# Patient Record
Sex: Female | Born: 2000 | Race: White | Hispanic: Yes | Marital: Single | Smoking: Never smoker
Health system: Southern US, Community
[De-identification: ages and names within clinical notes are randomized; demographics above are authoritative.]

## PROBLEM LIST (undated history)

## (undated) ENCOUNTER — Inpatient Hospital Stay (HOSPITAL_COMMUNITY): Payer: Self-pay

## (undated) DIAGNOSIS — Z789 Other specified health status: Secondary | ICD-10-CM

## (undated) HISTORY — PX: NO PAST SURGERIES: SHX2092

---
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
## 2019-04-16 DIAGNOSIS — H52223 Regular astigmatism, bilateral: Secondary | ICD-10-CM | POA: Diagnosis not present

## 2019-04-16 DIAGNOSIS — H53029 Refractive amblyopia, unspecified eye: Secondary | ICD-10-CM | POA: Diagnosis not present

## 2019-04-16 DIAGNOSIS — H538 Other visual disturbances: Secondary | ICD-10-CM | POA: Diagnosis not present

## 2019-04-29 DIAGNOSIS — H5213 Myopia, bilateral: Secondary | ICD-10-CM | POA: Diagnosis not present

## 2019-05-26 DIAGNOSIS — H5213 Myopia, bilateral: Secondary | ICD-10-CM | POA: Diagnosis not present

## 2019-07-21 ENCOUNTER — Inpatient Hospital Stay (HOSPITAL_COMMUNITY): Payer: Medicaid Other

## 2019-07-21 ENCOUNTER — Encounter (HOSPITAL_COMMUNITY): Payer: Self-pay | Admitting: Obstetrics and Gynecology

## 2019-07-21 ENCOUNTER — Inpatient Hospital Stay (HOSPITAL_COMMUNITY)
Admission: AD | Admit: 2019-07-21 | Discharge: 2019-07-21 | Disposition: A | Discharge status: Home / Self Care | Payer: Medicaid Other | Attending: Obstetrics and Gynecology | Admitting: Obstetrics and Gynecology

## 2019-07-21 ENCOUNTER — Other Ambulatory Visit: Payer: Self-pay

## 2019-07-21 DIAGNOSIS — R109 Unspecified abdominal pain: Secondary | ICD-10-CM | POA: Insufficient documentation

## 2019-07-21 DIAGNOSIS — Z3A00 Weeks of gestation of pregnancy not specified: Secondary | ICD-10-CM

## 2019-07-21 DIAGNOSIS — O26891 Other specified pregnancy related conditions, first trimester: Secondary | ICD-10-CM | POA: Insufficient documentation

## 2019-07-21 DIAGNOSIS — O99891 Other specified diseases and conditions complicating pregnancy: Secondary | ICD-10-CM | POA: Diagnosis not present

## 2019-07-21 DIAGNOSIS — M549 Dorsalgia, unspecified: Secondary | ICD-10-CM

## 2019-07-21 DIAGNOSIS — O3680X0 Pregnancy with inconclusive fetal viability, not applicable or unspecified: Secondary | ICD-10-CM

## 2019-07-21 DIAGNOSIS — Y92414 Local residential or business street as the place of occurrence of the external cause: Secondary | ICD-10-CM | POA: Diagnosis not present

## 2019-07-21 DIAGNOSIS — Z3A01 Less than 8 weeks gestation of pregnancy: Secondary | ICD-10-CM | POA: Insufficient documentation

## 2019-07-21 DIAGNOSIS — O9A211 Injury, poisoning and certain other consequences of external causes complicating pregnancy, first trimester: Secondary | ICD-10-CM | POA: Diagnosis not present

## 2019-07-21 DIAGNOSIS — M545 Low back pain: Secondary | ICD-10-CM | POA: Diagnosis not present

## 2019-07-21 LAB — CBC
HCT: 37 % (ref 36.0–46.0)
Hemoglobin: 11.9 g/dL — ABNORMAL LOW (ref 12.0–15.0)
MCH: 26.7 pg (ref 26.0–34.0)
MCHC: 32.2 g/dL (ref 30.0–36.0)
MCV: 83 fL (ref 80.0–100.0)
Platelets: 249 10*3/uL (ref 150–400)
RBC: 4.46 MIL/uL (ref 3.87–5.11)
RDW: 16.3 % — ABNORMAL HIGH (ref 11.5–15.5)
WBC: 6.1 10*3/uL (ref 4.0–10.5)
nRBC: 0 % (ref 0.0–0.2)

## 2019-07-21 LAB — WET PREP, GENITAL
Clue Cells Wet Prep HPF POC: NONE SEEN
Sperm: NONE SEEN
Trich, Wet Prep: NONE SEEN
Yeast Wet Prep HPF POC: NONE SEEN

## 2019-07-21 LAB — URINALYSIS, ROUTINE W REFLEX MICROSCOPIC
Bacteria, UA: NONE SEEN
Bilirubin Urine: NEGATIVE
Glucose, UA: NEGATIVE mg/dL
Hgb urine dipstick: NEGATIVE
Ketones, ur: 80 mg/dL — AB
Leukocytes,Ua: NEGATIVE
Nitrite: NEGATIVE
Protein, ur: 100 mg/dL — AB
Specific Gravity, Urine: 1.03 (ref 1.005–1.030)
pH: 5 (ref 5.0–8.0)

## 2019-07-21 LAB — POCT PREGNANCY, URINE: Preg Test, Ur: POSITIVE — AB

## 2019-07-21 LAB — HCG, QUANTITATIVE, PREGNANCY: hCG, Beta Chain, Quant, S: 654 m[IU]/mL — ABNORMAL HIGH (ref <5)
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
## 2019-07-21 NOTE — Discharge Instructions (Signed)
Abdominal Pain During Pregnancy  Belly (abdominal) pain is common during pregnancy. There are many possible causes. Most of the time, it is not a serious problem. Other times, it can be a sign that something is wrong with the pregnancy. Always tell your doctor if you have belly pain. Follow these instructions at home:  Do not have sex or put anything in your vagina until your pain goes away completely.  Get plenty of rest until your pain gets better.  Drink enough fluid to keep your pee (urine) pale yellow.  Take over-the-counter and prescription medicines only as told by your doctor.  Keep all follow-up visits as told by your doctor. This is important. Contact a doctor if:  Your pain continues or gets worse after resting.  You have lower belly pain that: ? Comes and goes at regular times. ? Spreads to your back. ? Feels like menstrual cramps.  You have pain or burning when you pee (urinate). Get help right away if:  You have a fever or chills.  You have vaginal bleeding.  You are leaking fluid from your vagina.  You are passing tissue from your vagina.  You throw up (vomit) for more than 24 hours.  You have watery poop (diarrhea) for more than 24 hours.  Your baby is moving less than usual.  You feel very weak or faint.  You have shortness of breath.  You have very bad pain in your upper belly. Summary  Belly (abdominal) pain is common during pregnancy. There are many possible causes.  If you have belly pain during pregnancy, tell your doctor right away.  Keep all follow-up visits as told by your doctor. This is important. This information is not intended to replace advice given to you by your health care provider. Make sure you discuss any questions you have with your health care provider. Document Revised: 06/23/2018 Document Reviewed: 06/07/2016 Elsevier Patient Education  [place].   Acute Back Pain, Adult Acute back pain is sudden and usually  short-lived. It is often caused by an injury to the muscles and tissues in the back. The injury may result from:  A muscle or ligament getting overstretched or torn (strained). Ligaments are tissues that connect bones to each other. Lifting something improperly can cause a back strain.  Wear and tear (degeneration) of the spinal disks. Spinal disks are circular tissue that provides cushioning between the bones of the spine (vertebrae).  Twisting motions, such as while playing sports or doing yard work.  A hit to the back.  Arthritis. You may have a physical exam, lab tests, and imaging tests to find the cause of your pain. Acute back pain usually goes away with rest and home care. Follow these instructions at home: Managing pain, stiffness, and swelling  Take over-the-counter and prescription medicines only as told by your health care provider.  Your health care provider may recommend applying ice during the first 24-48 hours after your pain starts. To do this: ? Put ice in a plastic bag. ? Place a towel between your skin and the bag. ? Leave the ice on for 20 minutes, 2-3 times a day.  If directed, apply heat to the affected area as often as told by your health care provider. Use the heat source that your health care provider recommends, such as a moist heat pack or a heating pad. ? Place a towel between your skin and the heat source. ? Leave the heat on for 20-30 minutes. ? Remove  the heat if your skin turns bright red. This is especially important if you are unable to feel pain, heat, or cold. You have a greater risk of getting burned. Activity   Do not stay in bed. Staying in bed for more than 1-2 days can delay your recovery.  Sit up and stand up straight. Avoid leaning forward when you sit, or hunching over when you stand. ? If you work at a desk, sit close to it so you do not need to lean over. Keep your chin tucked in. Keep your neck drawn back, and keep your elbows bent at a  right angle. Your arms should look like the letter "L." ? Sit high and close to the steering wheel when you drive. Add lower back (lumbar) support to your car seat, if needed.  Take short walks on even surfaces as soon as you are able. Try to increase the length of time you walk each day.  Do not sit, drive, or stand in one place for more than 30 minutes at a time. Sitting or standing for long periods of time can put stress on your back.  Do not drive or use heavy machinery while taking prescription pain medicine.  Use proper lifting techniques. When you bend and lift, use positions that put less stress on your back: ? [place] your knees. ? Keep the load close to your body. ? Avoid twisting.  Exercise regularly as told by your health care provider. Exercising helps your back heal faster and helps prevent back injuries by keeping muscles strong and flexible.  Work with a physical therapist to make a safe exercise program, as recommended by your health care provider. Do any exercises as told by your physical therapist. Lifestyle  Maintain a healthy weight. Extra weight puts stress on your back and makes it difficult to have good posture.  Avoid activities or situations that make you feel anxious or stressed. Stress and anxiety increase muscle tension and can make back pain worse. Learn ways to manage anxiety and stress, such as through exercise. General instructions  Sleep on a firm mattress in a comfortable position. Try lying on your side with your knees slightly bent. If you lie on your back, put a pillow under your knees.  Follow your treatment plan as told by your health care provider. This may include: ? Cognitive or behavioral therapy. ? Acupuncture or massage therapy. ? Meditation or yoga. Contact a health care provider if:  You have pain that is not relieved with rest or medicine.  You have increasing pain going down into your legs or buttocks.  Your pain does not improve  after 2 weeks.  You have pain at night.  You lose weight without trying.  You have a fever or chills. Get help right away if:  You develop new bowel or bladder control problems.  You have unusual weakness or numbness in your arms or legs.  You develop nausea or vomiting.  You develop abdominal pain.  You feel faint. Summary  Acute back pain is sudden and usually short-lived.  Use proper lifting techniques. When you bend and lift, use positions that put less stress on your back.  Take over-the-counter and prescription medicines and apply heat or ice as directed by your health care provider. This information is not intended to replace advice given to you by your health care provider. Make sure you discuss any questions you have with your health care provider. Document Revised: 06/24/2018 Document Reviewed: 10/17/2016 Elsevier Patient  Education  2020 Elsevier Inc.  

## 2019-07-22 LAB — GC/CHLAMYDIA PROBE AMP (~~LOC~~) NOT AT ARMC
Chlamydia: NEGATIVE
Comment: NEGATIVE
Comment: NORMAL
Neisseria Gonorrhea: NEGATIVE

## 2019-07-24 ENCOUNTER — Ambulatory Visit: Payer: Medicaid Other

## 2019-08-06 ENCOUNTER — Inpatient Hospital Stay (HOSPITAL_COMMUNITY)
Admission: AD | Admit: 2019-08-06 | Discharge: 2019-08-06 | Disposition: A | Discharge status: Home / Self Care | Payer: Medicaid Other | Attending: Obstetrics and Gynecology | Admitting: Obstetrics and Gynecology

## 2019-08-06 ENCOUNTER — Encounter (HOSPITAL_COMMUNITY): Payer: Self-pay | Admitting: Obstetrics and Gynecology

## 2019-08-06 ENCOUNTER — Inpatient Hospital Stay (HOSPITAL_COMMUNITY): Payer: Medicaid Other

## 2019-08-06 ENCOUNTER — Other Ambulatory Visit: Payer: Self-pay

## 2019-08-06 DIAGNOSIS — Z3A01 Less than 8 weeks gestation of pregnancy: Secondary | ICD-10-CM | POA: Insufficient documentation

## 2019-08-06 DIAGNOSIS — O209 Hemorrhage in early pregnancy, unspecified: Secondary | ICD-10-CM

## 2019-08-06 DIAGNOSIS — O200 Threatened abortion: Secondary | ICD-10-CM | POA: Insufficient documentation

## 2019-08-06 DIAGNOSIS — N939 Abnormal uterine and vaginal bleeding, unspecified: Secondary | ICD-10-CM | POA: Diagnosis present

## 2019-08-06 LAB — URINALYSIS, ROUTINE W REFLEX MICROSCOPIC
Bacteria, UA: NONE SEEN
Bilirubin Urine: NEGATIVE
Glucose, UA: NEGATIVE mg/dL
Ketones, ur: NEGATIVE mg/dL
Leukocytes,Ua: NEGATIVE
Nitrite: NEGATIVE
Protein, ur: NEGATIVE mg/dL
Specific Gravity, Urine: 1.016 (ref 1.005–1.030)
pH: 6 (ref 5.0–8.0)

## 2019-08-06 LAB — CBC
HCT: 39.8 % (ref 36.0–46.0)
Hemoglobin: 12.7 g/dL (ref 12.0–15.0)
MCH: 26.7 pg (ref 26.0–34.0)
MCHC: 31.9 g/dL (ref 30.0–36.0)
MCV: 83.8 fL (ref 80.0–100.0)
Platelets: 243 10*3/uL (ref 150–400)
RBC: 4.75 MIL/uL (ref 3.87–5.11)
RDW: 15.9 % — ABNORMAL HIGH (ref 11.5–15.5)
WBC: 5.4 10*3/uL (ref 4.0–10.5)
nRBC: 0 % (ref 0.0–0.2)

## 2019-08-06 LAB — HCG, QUANTITATIVE, PREGNANCY: hCG, Beta Chain, Quant, S: 5346 m[IU]/mL — ABNORMAL HIGH (ref <5)

## 2019-08-06 MED ORDER — VITAFOL ULTRA 29-0.6-0.4-200 MG PO CAPS: 1.0000 | ORAL_CAPSULE | Freq: Every day | ORAL | 11 refills | Status: AC

## 2019-08-06 NOTE — Discharge Instructions (Signed)
Threatened Miscarriage  A threatened miscarriage occurs when a woman has vaginal bleeding during the first 20 weeks of pregnancy but the pregnancy has not ended. If you have vaginal bleeding during this time, your health care provider will do tests to make sure you are still pregnant. If the tests show that you are still pregnant and that the developing baby (fetus) inside your uterus is still growing, your condition is considered a threatened miscarriage. A threatened miscarriage does not mean your pregnancy will end, but it does increase the risk of losing your pregnancy (complete miscarriage). What are the causes? The cause of this condition is usually not known. For women who go on to have a complete miscarriage, the most common cause is an abnormal number of chromosomes in the developing baby. Chromosomes are the structures inside cells that hold all of a person's genetic material. What increases the risk? The following lifestyle factors may increase your risk of a miscarriage in early pregnancy:  Smoking.  Drinking excessive amounts of alcohol or caffeine.  Recreational drug use. The following preexisting health conditions may increase your risk of a miscarriage in early pregnancy:  Polycystic ovary syndrome.  Uterine fibroids.  Infections.  Diabetes mellitus. What are the signs or symptoms? Symptoms of this condition include:  Vaginal bleeding.  Mild abdominal pain or cramps. How is this diagnosed? If you have bleeding with or without abdominal pain before 20 weeks of pregnancy, your health care provider will do tests to check whether you are still pregnant. These will include:  Ultrasound. This test uses sound waves to create images of the inside of your uterus. This allows your health care provider to look at your developing baby and other structures, such as your placenta.  Pelvic exam. This is an internal exam of your vagina and cervix.  Measurement of your baby's heart  rate.  Laboratory tests such as blood tests, urine tests, or swabs for infection You may be diagnosed with a threatened miscarriage if:  Ultrasound testing shows that you are still pregnant.  Your baby's heart rate is strong.  A pelvic exam shows that the opening between your uterus and your vagina (cervix) is closed.  Blood tests confirm that you are still pregnant. How is this treated? No treatments have been shown to prevent a threatened miscarriage from going on to a complete miscarriage. However, the right home care is important. Follow these instructions at home:  Get plenty of rest.  Do not have sex or use tampons if you have vaginal bleeding.  Do not douche.  Do not smoke or use recreational drugs.  Do not drink alcohol.  Avoid caffeine.  Keep all follow-up prenatal visits as told by your health care provider. This is important. Contact a health care provider if:  You have light vaginal bleeding or spotting while pregnant.  You have abdominal pain or cramping.  You have a fever. Get help right away if:  You have heavy vaginal bleeding.  You have blood clots coming from your vagina.  You pass tissue from your vagina.  You leak fluid, or you have a gush of fluid from your vagina.  You have severe low back pain or abdominal cramps.  You have fever, chills, and severe abdominal pain. Summary  A threatened miscarriage occurs when a woman has vaginal bleeding during the first 20 weeks of pregnancy but the pregnancy has not ended.  The cause of a threatened miscarriage is usually not known.  Symptoms of this condition may   include vaginal bleeding and mild abdominal pain or cramps.  No treatments have been shown to prevent a threatened miscarriage from going on to a complete miscarriage.  Keep all follow-up prenatal visits as told by your health care provider. This is important. This information is not intended to replace advice given to you by your health  care provider. Make sure you discuss any questions you have with your health care provider. Document Revised: 04/11/2017 Document Reviewed: 06/01/2016 Elsevier Patient Education  2020 [employer].  [facility]          [facility] locations:  Hours may vary. Please call for an appointment  [facility]   [address], [place] [phone]  [facility] @ [facility]   [address]  [phone]  [facility] @ [facility]       [address] [phone]            [facility] @ [place]     [phone]          [facility] @ [facility]   [address] [phone]  [facility] @ [facility]  [address] [phone]     [facility] @ [facility])  [address]   [phone]

## 2019-08-06 NOTE — MAU Provider Note (Signed)
History     CSN: [id number]  Arrival date and time: 08/06/19 1346   First Provider Initiated Contact with Patient 08/06/19 1438      Chief Complaint  Patient presents with  . Vaginal Bleeding   HPI Beverly Shah 19 y.o. Positive pregnancy test on 07-19-19.  Has a history of irregular cycles.  Began having intermittent cramping and some vaginal spotting that was brown on yesterday.  Today the cramping has increased some but it is not continuous.  Bleeding is more red so she came for evaluation.  Was seen in early May and did not follow up for Quant.  Missed her appointment and did not reschedule.  Has not been seen yet for prenatal care.  Has not taken any medication for her cramping since it started.  OB History    Gravida  2   Para  1   Term  1   Preterm      AB      Living  1     SAB      TAB      Ectopic      Multiple  0   Live Births  1           Past Medical History:  Diagnosis Date  . Medical history non-contributory     Past Surgical History:  Procedure Laterality Date  . NO PAST SURGERIES      No family history on file.  Social History   Tobacco Use  . Smoking status: Never Smoker  . Smokeless tobacco: Never Used  Substance Use Topics  . Alcohol use: Not Currently  . Drug use: Not Currently    Types: Marijuana    Comment: quit 2 days ago     Allergies: No Known Allergies  Medications Prior to Admission  Medication Sig Dispense Refill Last Dose  . Prenatal Vit-Fe Phos-FA-Omega (VITAFOL GUMMIES) 3.33-0.333-34.8 MG CHEW Chew 3 each by mouth daily. (Patient not taking: Reported on 10/13/2018) 90 tablet 11     Review of Systems  Constitutional: Negative for fever.  Gastrointestinal: Positive for abdominal pain and nausea. Negative for vomiting.  Genitourinary: Positive for vaginal bleeding. Negative for dysuria and vaginal discharge.   Physical Exam   Blood pressure 128/66, pulse 77, temperature 99.2 F (37.3 C), resp.  rate 18, height 5\' 3"  (1.6 m), weight 77.1 kg, last menstrual period 06/18/2019, unknown if currently breastfeeding.  Physical Exam  Nursing note and vitals reviewed. Constitutional: She is oriented to person, place, and time. She appears well-developed and well-nourished.  HENT:  Head: Normocephalic.  Eyes: EOM are normal.  Cardiovascular: Normal rate.  Respiratory: Effort normal.  GI: Soft. There is no abdominal tenderness. There is no rebound and no guarding.  Musculoskeletal:        General: Normal range of motion.     Cervical back: Neck supple.  Neurological: She is alert and oriented to person, place, and time.  Skin: Skin is warm and dry.  Psychiatric: She has a normal mood and affect.    MAU Course  Procedures CLINICAL DATA:  Vaginal bleeding in first trimester of pregnancy; quantitative beta HCG 5,346 on 78/29/5621; uncertain LMP question 06/18/2019  EXAM: TRANSVAGINAL OB ULTRASOUND  TECHNIQUE: Transvaginal ultrasound was performed for complete evaluation of the gestation as well as the maternal uterus, adnexal regions, and pelvic cul-de-sac.  COMPARISON:  5/21  FINDINGS: Intrauterine gestational sac: Present, single, located in lower uterine segment  Yolk sac:  Present  Embryo:  Present  Cardiac Activity: Present, irregular  Heart Rate: Varied between 50 and 113 beats per minute.  CRL:   4.5 mm   6 w 1 d                  [place] EDC: 03/30/2020  Subchorionic hemorrhage:  Question trace subchorionic hemorrhage  Maternal uterus/adnexae:  Maternal uterus otherwise normal appearance.  RIGHT ovary normal size and morphology 2.2 x 2.3 x 2.6 cm, containing small corpus luteum.  LEFT ovary normal size and morphology, 2.0 x 1.0 x 2.0 cm.  No free pelvic fluid or adnexal masses.  IMPRESSION: Small gestational sac containing a yolk sac and a fetal pole identified in the lower uterine segment endometrium posteriorly.  Crown-rump length  corresponds to 6 weeks 1 day EGA.  Irregular fetal cardiac rate identified.  LABS Results for orders placed or performed during the hospital encounter of 08/06/19 (from the past 24 hour(s))  Urinalysis, Routine w reflex microscopic     Status: Abnormal   Collection Time: 08/06/19  2:05 PM  Result Value Ref Range   Color, Urine YELLOW YELLOW   APPearance CLEAR CLEAR   Specific Gravity, Urine 1.016 1.005 - 1.030   pH 6.0 5.0 - 8.0   Glucose, UA NEGATIVE NEGATIVE mg/dL   Hgb urine dipstick LARGE (A) NEGATIVE   Bilirubin Urine NEGATIVE NEGATIVE   Ketones, ur NEGATIVE NEGATIVE mg/dL   Protein, ur NEGATIVE NEGATIVE mg/dL   Nitrite NEGATIVE NEGATIVE   Leukocytes,Ua NEGATIVE NEGATIVE   RBC / HPF 0-5 0 - 5 RBC/hpf   WBC, UA 0-5 0 - 5 WBC/hpf   Bacteria, UA NONE SEEN NONE SEEN   Squamous Epithelial / LPF 0-5 0 - 5   Mucus PRESENT   CBC     Status: Abnormal   Collection Time: 08/06/19  2:21 PM  Result Value Ref Range   WBC 5.4 4.0 - 10.5 K/uL   RBC 4.75 3.87 - 5.11 MIL/uL   Hemoglobin 12.7 12.0 - 15.0 g/dL   HCT 94.4 96.7 - 59.1 %   MCV 83.8 80.0 - 100.0 fL   MCH 26.7 26.0 - 34.0 pg   MCHC 31.9 30.0 - 36.0 g/dL   RDW 63.8 (H) 46.6 - 59.9 %   Platelets 243 150 - 400 K/uL   nRBC 0.0 0.0 - 0.2 %  hCG, quantitative, pregnancy     Status: Abnormal   Collection Time: 08/06/19  2:21 PM  Result Value Ref Range   hCG, Beta Chain, Quant, S 5,346 (H) <5 mIU/mL     MDM Discussed ultrasound results with client.  Advised that at this time we cannot know for certain whether this is a healthy pregnancy or not.  There are some signs that it might not be healthy - irregular FHT, sac in lower uterine segment.  Advised there is no medication or other treatments can be given to make the pregnancy be healthy if it is not.  Discussed that her own body's signals may give an indication if this will be a healthy pregnancy.  If more cramping and bleeding occur, this may be a miscarriage.  Discussed usual  course of miscarriage and advised to return if she is having dizziness or feeling faint.  Will schedule [place] in 2 weeks to recheck.  Assessment and Plan  Threatened miscarriage at 6w gestation  Plan Expect ultrasound to call and schedule an appointment for 2 weeks to recheck this pregnancy. Return if you are having bleeding to the point you are  feeling faint or dizzy. Begin prenatal care if there is no further bleeding.  Office and numbers given.  Shaley Leavens L Tilman Mcclaren 08/06/2019, 2:40 PM

## 2019-08-06 NOTE — MAU Note (Signed)
Pt reports she stared having some dark brown spotting yesterday. Today is more pink then turned to more like period bleeding . Has had some cramping on and off today as well.

## 2019-08-09 ENCOUNTER — Other Ambulatory Visit: Payer: Self-pay

## 2019-08-09 ENCOUNTER — Inpatient Hospital Stay (HOSPITAL_COMMUNITY): Payer: Medicaid Other

## 2019-08-09 ENCOUNTER — Encounter (HOSPITAL_COMMUNITY): Payer: Self-pay | Admitting: Obstetrics and Gynecology

## 2019-08-09 ENCOUNTER — Inpatient Hospital Stay (HOSPITAL_COMMUNITY)
Admission: AD | Admit: 2019-08-09 | Discharge: 2019-08-10 | Disposition: A | Discharge status: Home / Self Care | Payer: Medicaid Other | Source: Ambulatory Visit | Attending: Obstetrics and Gynecology | Admitting: Obstetrics and Gynecology

## 2019-08-09 DIAGNOSIS — Z6790 Unspecified blood type, Rh positive: Secondary | ICD-10-CM | POA: Diagnosis not present

## 2019-08-09 DIAGNOSIS — O26891 Other specified pregnancy related conditions, first trimester: Secondary | ICD-10-CM | POA: Insufficient documentation

## 2019-08-09 DIAGNOSIS — Z3A01 Less than 8 weeks gestation of pregnancy: Secondary | ICD-10-CM | POA: Diagnosis not present

## 2019-08-09 DIAGNOSIS — R109 Unspecified abdominal pain: Secondary | ICD-10-CM | POA: Diagnosis not present

## 2019-08-09 DIAGNOSIS — O4690 Antepartum hemorrhage, unspecified, unspecified trimester: Secondary | ICD-10-CM

## 2019-08-09 DIAGNOSIS — O039 Complete or unspecified spontaneous abortion without complication: Secondary | ICD-10-CM | POA: Insufficient documentation

## 2019-08-09 DIAGNOSIS — O209 Hemorrhage in early pregnancy, unspecified: Secondary | ICD-10-CM | POA: Diagnosis not present

## 2019-08-09 DIAGNOSIS — Z3A00 Weeks of gestation of pregnancy not specified: Secondary | ICD-10-CM | POA: Diagnosis not present

## 2019-08-09 LAB — COMPREHENSIVE METABOLIC PANEL
ALT: 13 U/L (ref 0–44)
AST: 15 U/L (ref 15–41)
Albumin: 3.6 g/dL (ref 3.5–5.0)
Alkaline Phosphatase: 55 U/L (ref 38–126)
Anion gap: 10 (ref 5–15)
BUN: 8 mg/dL (ref 6–20)
CO2: 22 mmol/L (ref 22–32)
Calcium: 8.6 mg/dL — ABNORMAL LOW (ref 8.9–10.3)
Chloride: 109 mmol/L (ref 98–111)
Creatinine, Ser: 0.58 mg/dL (ref 0.44–1.00)
GFR calc Af Amer: >60 mL/min (ref >60)
GFR calc non Af Amer: >60 mL/min (ref >60)
Glucose, Bld: 111 mg/dL — ABNORMAL HIGH (ref 70–99)
Potassium: 4 mmol/L (ref 3.5–5.1)
Sodium: 141 mmol/L (ref 135–145)
Total Bilirubin: 0.6 mg/dL (ref 0.3–1.2)
Total Protein: 6.6 g/dL (ref 6.5–8.1)

## 2019-08-09 LAB — CBC
HCT: 36.5 % (ref 36.0–46.0)
Hemoglobin: 11.5 g/dL — ABNORMAL LOW (ref 12.0–15.0)
MCH: 26.8 pg (ref 26.0–34.0)
MCHC: 31.5 g/dL (ref 30.0–36.0)
MCV: 85.1 fL (ref 80.0–100.0)
Platelets: 233 10*3/uL (ref 150–400)
RBC: 4.29 MIL/uL (ref 3.87–5.11)
RDW: 15.9 % — ABNORMAL HIGH (ref 11.5–15.5)
WBC: 6.2 10*3/uL (ref 4.0–10.5)
nRBC: 0 % (ref 0.0–0.2)

## 2019-08-09 LAB — URINALYSIS, ROUTINE W REFLEX MICROSCOPIC
Bacteria, UA: NONE SEEN
Bilirubin Urine: NEGATIVE
Glucose, UA: NEGATIVE mg/dL
Ketones, ur: NEGATIVE mg/dL
Leukocytes,Ua: NEGATIVE
Nitrite: NEGATIVE
Protein, ur: NEGATIVE mg/dL
RBC / HPF: >50 RBC/hpf — ABNORMAL HIGH (ref 0–5)
Specific Gravity, Urine: 1.026 (ref 1.005–1.030)
pH: 6 (ref 5.0–8.0)

## 2019-08-09 LAB — HCG, QUANTITATIVE, PREGNANCY: hCG, Beta Chain, Quant, S: 1313 m[IU]/mL — ABNORMAL HIGH (ref <5)

## 2019-08-09 NOTE — MAU Note (Signed)
Patient reports to MAU stating she has been experiencing vaginal bleeding since her last visit to MAU however today she passed some clots and what appeared to be grayish tissue. Pt reports off and on today she has had cramping and back pain that can go anywhere from a 0-8/10. Pt reports the use of 4 pads all day today. Pt denies pain currently.   119/65 bp 73 pulse 98.1 temp 100 spO2
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
## 2019-08-10 LAB — GC/CHLAMYDIA PROBE AMP (~~LOC~~) NOT AT ARMC
Chlamydia: NEGATIVE
Comment: NEGATIVE
Comment: NORMAL
Neisseria Gonorrhea: NEGATIVE

## 2019-08-10 LAB — WET PREP, GENITAL
Clue Cells Wet Prep HPF POC: NONE SEEN
Sperm: NONE SEEN
Trich, Wet Prep: NONE SEEN
Yeast Wet Prep HPF POC: NONE SEEN

## 2019-08-10 MED ORDER — MISOPROSTOL 200 MCG PO TABS
800.0000 ug | ORAL_TABLET | Freq: Once | ORAL | 0 refills | Status: DC
Start: 2019-08-10 — End: 2022-03-25

## 2019-08-10 MED ORDER — OXYCODONE HCL 5 MG PO TABS: 5.0000 mg | ORAL_TABLET | Freq: Three times a day (TID) | ORAL | 0 refills | Status: AC | PRN

## 2019-08-10 MED ORDER — PROMETHAZINE HCL 12.5 MG PO TABS
12.5000 mg | ORAL_TABLET | Freq: Four times a day (QID) | ORAL | 0 refills | Status: DC | PRN
Start: 2019-08-10 — End: 2022-03-25

## 2019-08-10 MED ORDER — IBUPROFEN 600 MG PO TABS
600.0000 mg | ORAL_TABLET | Freq: Four times a day (QID) | ORAL | 0 refills | Status: DC | PRN
Start: 2019-08-10 — End: 2022-03-25

## 2019-08-10 MED ORDER — ACETAMINOPHEN 325 MG PO TABS
650.0000 mg | ORAL_TABLET | Freq: Four times a day (QID) | ORAL | 0 refills | Status: AC | PRN
Start: 2019-08-10 — End: 2020-08-09

## 2019-08-10 NOTE — Discharge Instructions (Signed)
FACTS YOU SHOULD KNOW  WHAT IS AN EARLY PREGNANCY FAILURE? Once the egg is fertilized with the sperm and begins to develop, it attaches to the lining of the uterus. This early pregnancy tissue may not develop into an embryo (the beginning stage of a baby). Sometimes an embryo does develop but does not continue to grow. These problems can be seen on ultrasound.   MANAGEMNT OF EARLY PREGNANCY FAILURE: About 4 out of 100 (0.25%) women will have a pregnancy loss in her lifetime.  One in five pregnancies is found to be an early pregnancy failure.  There are 3 ways to care for an early pregnancy failure:   (1) Surgery, (2) Medicine, (3) Waiting for you to pass the pregnancy on your own. The decision as to how to proceed after being diagnosed with and early pregnancy failure is an individual one.  The decision can be made only after appropriate counseling.  You need to weigh the pros and cons of the 3 choices. Then you can make the choice that works for you. SURGERY (D&E) . Procedure over in 1 day . Requires being put to sleep . Bleeding may be light . Possible problems during surgery, including injury to womb(uterus) . Care provider has more control Medicine (CYTOTEC) . The complete procedure may take days to weeks . No Surgery . Bleeding may be heavy at times . There may be drug side effects . Patient has more control Waiting . You may choose to wait, in which case your own body may complete the passing of the abnormal early pregnancy on its own in about 2-4 weeks . Your bleeding may be heavy at times . There is a small possibility that you may need surgery if the bleeding is too much or not all of the pregnancy has passed. CYTOTEC MANAGEMENT Prostaglandins (cytotec) are the most widely used drug for this purpose. They cause the uterus to cramp and contract. You will place the medicine yourself inside your vagina in the privacy of your home. Empting of the uterus should occur within 3 days but  the process may continue for several weeks. The bleeding may seem heavy at times. POSSIBLE SIDE EFFECTS FROM CYTOTEC . Nausea   Vomiting . Diarrhea Fever . Chills  Hot Flashes Side effects  from the process of the early pregnancy failure include: . Cramping  Bleeding . Headaches  Dizziness RISKS: This is a low risk procedure. Less than 1 in 100 women has a complication. An incomplete passage of the early pregnancy may occur. Also, Hemorrhage (heavy bleeding) could happen.  Rarely the pregnancy will not be passed completely. Excessively heavy bleeding may occur.  Your doctor may need to perform surgery to empty the uterus (D&E). Afterwards: Everybody will feel differently after the early pregnancy completion. You may have soreness or cramps for a day or two. You may have soreness or cramps for day or two.  You may have light bleeding for up to 2 weeks. You may be as active as you feel like being. If you have any of the following problems you may call [facility] at [phone]. . If you have pain that does not get better  with pain medication . Bleeding that soaks through 2 thick full-sized sanitary pads in an hour . Cramps that last longer than 2 days . Foul smelling discharge . Fever above 100.4 degrees F Even if you do not have any of these symptoms, you should have a follow-up exam to make sure you   are healing properly. This appointment will be made for you before you leave the hospital. Your next normal period will start again in 4-6 week after the loss. You can get pregnant soon after the loss, so use birth control right away. Finally: Make sure all your questions are answered before during and after any procedure. Follow up with medical care and family planning methods.         Miscarriage A miscarriage is the loss of an unborn baby (fetus) before the 20th week of pregnancy. Most miscarriages happen during the first 3 months of pregnancy. Sometimes, a miscarriage can  happen before a woman knows that she is pregnant. Having a miscarriage can be an emotional experience. If you have had a miscarriage, talk with your health care provider about any questions you may have about miscarrying, the grieving process, and your plans for future pregnancy. What are the causes? A miscarriage may be caused by:  Problems with the genes or chromosomes of the fetus. These problems make it impossible for the baby to develop normally. They are often the result of random errors that occur early in the development of the baby, and are not passed from parent to child (not inherited).  Infection of the cervix or uterus.  Conditions that affect hormone balance in the body.  Problems with the cervix, such as the cervix opening and thinning before pregnancy is at term (cervical insufficiency).  Problems with the uterus. These may include: ? A uterus with an abnormal shape. ? Fibroids in the uterus. ? Congenital abnormalities. These are problems that were present at birth.  Certain medical conditions.  Smoking, drinking alcohol, or using drugs.  Injury (trauma). In many cases, the cause of a miscarriage is not known. What are the signs or symptoms? Symptoms of this condition include:  Vaginal bleeding or spotting, with or without cramps or pain.  Pain or cramping in the abdomen or lower back.  Passing fluid, tissue, or blood clots from the vagina. How is this diagnosed? This condition may be diagnosed based on:  A physical exam.  Ultrasound.  Blood tests.  Urine tests. How is this treated? Treatment for a miscarriage is sometimes not necessary if you naturally pass all the tissue that was in your uterus. If necessary, this condition may be treated with:  Dilation and curettage (D&C). This is a procedure in which the cervix is stretched open and the lining of the uterus (endometrium) is scraped. This is done only if tissue from the fetus or placenta remains in the  body (incomplete miscarriage).  Medicines, such as: ? Antibiotic medicine, to treat infection. ? Medicine to help the body pass any remaining tissue. ? Medicine to reduce (contract) the size of the uterus. These medicines may be given if you have a lot of bleeding. If you have Rh negative blood and your baby was Rh positive, you will need a shot of a medicine called Rh immunoglobulinto protect your future babies from Rh blood problems. "Rh-negative" and "Rh-positive" refer to whether or not the blood has a specific protein found on the surface of red blood cells (Rh factor). Follow these instructions at home: Medicines   Take over-the-counter and prescription medicines only as told by your health care provider.  If you were prescribed antibiotic medicine, take it as told by your health care provider. Do not stop taking the antibiotic even if you start to feel better.  Do not take NSAIDs, such as aspirin and ibuprofen, unless they are  approved by your health care provider. These medicines can cause bleeding. Activity  Rest as directed. Ask your health care provider what activities are safe for you.  Have someone help with home and family responsibilities during this time. General instructions  Keep track of the number of sanitary pads you use each day and how soaked (saturated) they are. Write down this information.  Monitor the amount of tissue or blood clots that you pass from your vagina. Save any large amounts of tissue for your health care provider to examine.  Do not use tampons, douche, or have sex until your health care provider approves.  To help you and your partner with the process of grieving, talk with your health care provider or seek counseling.  When you are ready, meet with your health care provider to discuss any important steps you should take for your health. Also, discuss steps you should take to have a healthy pregnancy in the future.  Keep all follow-up visits  as told by your health care provider. This is important. Where to find more information  The American Congress of Obstetricians and Gynecologists: www.acog.org  [place] Department of Health and Cytogeneticist of Women's Health: http://hoffman.com/ Contact a health care provider if:  You have a fever or chills.  You have a foul smelling vaginal discharge.  You have more bleeding instead of less. Get help right away if:  You have severe cramps or pain in your back or abdomen.  You pass blood clots or tissue from your vagina that is walnut-sized or larger.  You soak more than 1 regular sanitary pad in an hour.  You become light-headed or weak.  You pass out.  You have feelings of sadness that take over your thoughts, or you have thoughts of hurting yourself. Summary  Most miscarriages happen in the first 3 months of pregnancy. Sometimes miscarriage happens before a woman even knows that she is pregnant.  Follow your health care provider's instruction for home care. Keep all follow-up appointments.  To help you and your partner with the process of grieving, talk with your health care provider or seek counseling. This information is not intended to replace advice given to you by your health care provider. Make sure you discuss any questions you have with your health care provider. Document Revised: 06/27/2018 Document Reviewed: 04/10/2016 Elsevier Patient Education  2020 [employer]         Managing Pregnancy Loss Pregnancy loss can happen any time during a pregnancy. Often the cause is not known. It is rarely because of anything you did. Pregnancy loss in early pregnancy (during the first trimester) is called a miscarriage. This type of pregnancy loss is the most common. Pregnancy loss that happens after 20 weeks of pregnancy is called fetal demise if the baby's heart stops beating before birth. Fetal demise is much less common. Some women experience spontaneous labor  shortly after fetal demise resulting in a stillborn birth (stillbirth). Any pregnancy loss can be devastating. You will need to recover both physically and emotionally. Most women are able to get pregnant again after a pregnancy loss and deliver a healthy baby. How to manage emotional recovery  Pregnancy loss is very hard emotionally. You may feel many different emotions while you grieve. You may feel sad and angry. You may also feel guilty. It is normal to have periods of crying. Emotional recovery can take longer than physical recovery. It is different for everyone. Taking these steps can help you in  managing this loss:  Remember that it is unlikely you did anything to cause the pregnancy loss.  Share your thoughts and feelings with friends, family, and your partner. Remember that your partner is also recovering emotionally.  Make sure you have a good support system. Do not spend too much time alone.  Meet with a pregnancy loss counselor or join a pregnancy loss support group.  Get enough sleep and eat a healthy diet. Return to regular exercise when you have recovered physically.  Do not use drugs or alcohol to manage your emotions.  Consider seeing a mental health professional to help you recover emotionally.  Ask a friend or loved one to help you decide what to do with any clothing and nursery items you received for your baby. In the case of a stillbirth, many women benefit from taking additional steps in the grieving process. You may want to:  Hold your baby after the birth.  Name your baby.  Request a birth certificate.  Create a keepsake such as handprints or footprints.  Dress your baby and have a picture taken.  Make funeral arrangements.  Ask for a baptism or blessing. Hospitals have staff members who can help you with all these arrangements. How to recognize emotional stress It is normal to have emotional stress after a pregnancy loss. But emotional stress that lasts  a long time or becomes severe requires treatment. Watch out for these signs of severe emotional stress:  Sadness, anger, or guilt that is not going away and is interfering with your normal activities.  Relationship problems that have occurred or gotten worse since the pregnancy loss.  Signs of depression that last longer than 2 weeks. These may include: ? Sadness. ? Anxiety. ? Hopelessness. ? Loss of interest in activities you enjoy. ? Inability to concentrate. ? Trouble sleeping or sleeping too much. ? Loss of appetite or overeating. ? Thoughts of death or of hurting yourself. Follow these instructions at home:  Take over-the-counter and prescription medicines only as told by your health care provider.  Rest at home until your energy level returns. Return to your normal activities as told by your health care provider. Ask your health care provider what activities are safe for you.  When you are ready, meet with your health care provider to discuss steps to take for a future pregnancy.  Keep all follow-up visits as told by your health care provider. This is important. Where to find support  To help you and your partner with the process of grieving, talk with your health care provider or seek counseling.  Consider meeting with others who have experienced pregnancy loss. Ask your health care provider about support groups and resources. Where to find more information  U.S. Department of Health and Programmer, systems on Women's Health: [place]s.tn  American Pregnancy Association: www.americanpregnancy.org Contact a health care provider if:  You continue to experience grief, sadness, or lack of motivation for everyday activities, and those feelings do not improve over time.  You are struggling to recover emotionally, especially if you are using alcohol or substances to help. Get help right away if:  You have thoughts of hurting yourself or others. If you ever feel  like you may hurt yourself or others, or have thoughts about taking your own life, get help right away. You can go to your nearest emergency department or call:  Your local emergency services (911 in the [place]  A suicide crisis helpline, such as the [place]  at [phone]. This is open 24 hours a day. Summary  Any pregnancy loss can be difficult physically and emotionally.  You may experience many different emotions while you grieve. Emotional recovery can last longer than physical recovery.  It is normal to have emotional stress after a pregnancy loss. But emotional stress that lasts a long time or becomes severe requires treatment.  See your health care provider if you are struggling emotionally after a pregnancy loss. This information is not intended to replace advice given to you by your health care provider. Make sure you discuss any questions you have with your health care provider. Document Revised: 06/25/2018 Document Reviewed: 05/16/2017 Elsevier Patient Education  2020 [employer].

## 2019-08-18 ENCOUNTER — Other Ambulatory Visit: Payer: Medicaid Other

## 2019-09-23 ENCOUNTER — Ambulatory Visit: Payer: Medicaid Other | Admitting: Obstetrics and Gynecology

## 2019-10-01 ENCOUNTER — Other Ambulatory Visit: Payer: Self-pay | Admitting: Nurse Practitioner

## 2019-10-01 DIAGNOSIS — O200 Threatened abortion: Secondary | ICD-10-CM
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
## 2021-10-23 ENCOUNTER — Ambulatory Visit (INDEPENDENT_AMBULATORY_CARE_PROVIDER_SITE_OTHER): Payer: Medicaid Other

## 2021-10-23 DIAGNOSIS — R110 Nausea: Secondary | ICD-10-CM

## 2021-10-23 DIAGNOSIS — Z3200 Encounter for pregnancy test, result unknown: Secondary | ICD-10-CM

## 2021-10-23 DIAGNOSIS — H538 Other visual disturbances: Secondary | ICD-10-CM | POA: Diagnosis not present

## 2021-10-23 DIAGNOSIS — N912 Amenorrhea, unspecified: Secondary | ICD-10-CM | POA: Diagnosis not present

## 2021-10-23 LAB — POCT URINE PREGNANCY: Preg Test, Ur: POSITIVE — AB

## 2021-10-23 MED ORDER — VITAFOL ULTRA 29-0.6-0.4-200 MG PO CAPS: 1.0000 | ORAL_CAPSULE | Freq: Every day | ORAL | 12 refills | Status: DC

## 2021-10-23 MED ORDER — DOXYLAMINE-PYRIDOXINE 10-10 MG PO TBEC: 2.0000 | DELAYED_RELEASE_TABLET | Freq: Every day | ORAL | 2 refills | Status: DC

## 2021-10-23 NOTE — Progress Notes (Signed)
..  [name] presents today for UPT. She reports nausea but no vomiting, requests rx. LMP:09/06/21    OBJECTIVE: Appears well, in no apparent distress.  OB History     Gravida  3   Para  1   Term  1   Preterm      AB  1   Living  1      SAB  1   IAB      Ectopic      Multiple  0   Live Births  1          Home UPT Result:Positive  In-Office UPT result:Positive I have reviewed the patient's medical, obstetrical, social, and family histories, and medications.   ASSESSMENT: Positive pregnancy test  PLAN Prenatal care to be completed at: [facility] and PNV sent to pharmacy

## 2021-10-31 ENCOUNTER — Inpatient Hospital Stay (HOSPITAL_COMMUNITY)
Admission: AD | Admit: 2021-10-31 | Discharge: 2021-11-01 | Disposition: A | Discharge status: Home / Self Care | Payer: Medicaid Other | Attending: Obstetrics and Gynecology | Admitting: Obstetrics and Gynecology

## 2021-10-31 ENCOUNTER — Inpatient Hospital Stay (HOSPITAL_COMMUNITY): Payer: Medicaid Other

## 2021-10-31 DIAGNOSIS — M549 Dorsalgia, unspecified: Secondary | ICD-10-CM | POA: Diagnosis not present

## 2021-10-31 DIAGNOSIS — O208 Other hemorrhage in early pregnancy: Secondary | ICD-10-CM | POA: Diagnosis not present

## 2021-10-31 DIAGNOSIS — Z3A01 Less than 8 weeks gestation of pregnancy: Secondary | ICD-10-CM | POA: Diagnosis not present

## 2021-10-31 DIAGNOSIS — O468X1 Other antepartum hemorrhage, first trimester: Secondary | ICD-10-CM | POA: Diagnosis not present

## 2021-10-31 DIAGNOSIS — Z3A08 8 weeks gestation of pregnancy: Secondary | ICD-10-CM | POA: Diagnosis not present

## 2021-10-31 DIAGNOSIS — R519 Headache, unspecified: Secondary | ICD-10-CM | POA: Diagnosis present

## 2021-10-31 DIAGNOSIS — Z331 Pregnant state, incidental: Secondary | ICD-10-CM

## 2021-10-31 DIAGNOSIS — O26891 Other specified pregnancy related conditions, first trimester: Secondary | ICD-10-CM | POA: Diagnosis not present

## 2021-10-31 DIAGNOSIS — O418X10 Other specified disorders of amniotic fluid and membranes, first trimester, not applicable or unspecified: Secondary | ICD-10-CM | POA: Diagnosis not present

## 2021-10-31 DIAGNOSIS — O99321 Drug use complicating pregnancy, first trimester: Secondary | ICD-10-CM | POA: Insufficient documentation

## 2021-10-31 DIAGNOSIS — Z3201 Encounter for pregnancy test, result positive: Secondary | ICD-10-CM | POA: Diagnosis not present

## 2021-10-31 LAB — COMPREHENSIVE METABOLIC PANEL
ALT: 15 U/L (ref 0–44)
AST: 14 U/L — ABNORMAL LOW (ref 15–41)
Albumin: 3.7 g/dL (ref 3.5–5.0)
Alkaline Phosphatase: 45 U/L (ref 38–126)
Anion gap: 7 (ref 5–15)
BUN: 7 mg/dL (ref 6–20)
CO2: 23 mmol/L (ref 22–32)
Calcium: 8.8 mg/dL — ABNORMAL LOW (ref 8.9–10.3)
Chloride: 107 mmol/L (ref 98–111)
Creatinine, Ser: 0.71 mg/dL (ref 0.44–1.00)
GFR, Estimated: >60 mL/min (ref >60)
Glucose, Bld: 105 mg/dL — ABNORMAL HIGH (ref 70–99)
Potassium: 3.8 mmol/L (ref 3.5–5.1)
Sodium: 137 mmol/L (ref 135–145)
Total Bilirubin: 0.3 mg/dL (ref 0.3–1.2)
Total Protein: 6.7 g/dL (ref 6.5–8.1)

## 2021-10-31 LAB — CBC
HCT: 35.4 % — ABNORMAL LOW (ref 36.0–46.0)
Hemoglobin: 12.6 g/dL (ref 12.0–15.0)
MCH: 31.5 pg (ref 26.0–34.0)
MCHC: 35.6 g/dL (ref 30.0–36.0)
MCV: 88.5 fL (ref 80.0–100.0)
Platelets: 198 10*3/uL (ref 150–400)
RBC: 4 MIL/uL (ref 3.87–5.11)
RDW: 11.8 % (ref 11.5–15.5)
WBC: 7.2 10*3/uL (ref 4.0–10.5)
nRBC: 0 % (ref 0.0–0.2)

## 2021-10-31 LAB — WET PREP, GENITAL
Clue Cells Wet Prep HPF POC: NONE SEEN
Sperm: NONE SEEN
Trich, Wet Prep: NONE SEEN
WBC, Wet Prep HPF POC: <10 (ref <10)
Yeast Wet Prep HPF POC: NONE SEEN

## 2021-10-31 LAB — URINALYSIS, ROUTINE W REFLEX MICROSCOPIC
Bilirubin Urine: NEGATIVE
Glucose, UA: NEGATIVE mg/dL
Hgb urine dipstick: NEGATIVE
Ketones, ur: NEGATIVE mg/dL
Leukocytes,Ua: NEGATIVE
Nitrite: NEGATIVE
Protein, ur: NEGATIVE mg/dL
Specific Gravity, Urine: 1.021 (ref 1.005–1.030)
pH: 6 (ref 5.0–8.0)

## 2021-10-31 LAB — HCG, QUANTITATIVE, PREGNANCY: hCG, Beta Chain, Quant, S: 104197 m[IU]/mL — ABNORMAL HIGH (ref <5)
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
## 2021-11-01 DIAGNOSIS — Z3A08 8 weeks gestation of pregnancy: Secondary | ICD-10-CM | POA: Diagnosis not present

## 2021-11-01 DIAGNOSIS — O418X10 Other specified disorders of amniotic fluid and membranes, first trimester, not applicable or unspecified: Secondary | ICD-10-CM

## 2021-11-01 DIAGNOSIS — O468X1 Other antepartum hemorrhage, first trimester: Secondary | ICD-10-CM

## 2021-11-01 LAB — GC/CHLAMYDIA PROBE AMP (~~LOC~~) NOT AT ARMC
Chlamydia: NEGATIVE
Comment: NEGATIVE
Comment: NORMAL
Neisseria Gonorrhea: NEGATIVE

## 2021-11-01 NOTE — MAU Provider Note (Signed)
History     CSN: [id number]  Arrival date and time: 10/31/21 2111   Event Date/Time   First Provider Initiated Contact with Patient 11/01/21 0015      Chief Complaint  Patient presents with   Abdominal Pain   Headache   Back Pain   Jonell Brumbaugh, a  21 y.o. G3P1011 at [redacted]w[redacted]d presents to MAU with complaints of left sided pelvic and back pain that has been on-going for the last week. Patient complains that it is worsened by movement. Currently rating pain a 4/10. Denies attempts at relief. Denies vaginal bleeding or abnormal discharge. Denies urinary symptoms. Denies nausea, vomiting, or problems with constipation or diarrhea.           OB History     Gravida  3   Para  1   Term  1   Preterm      AB  1   Living  1      SAB  1   IAB      Ectopic      Multiple  0   Live Births  1           Past Medical History:  Diagnosis Date   Medical history non-contributory     Past Surgical History:  Procedure Laterality Date   NO PAST SURGERIES      No family history on file.  Social History   Tobacco Use   Smoking status: Never   Smokeless tobacco: Never  Vaping Use   Vaping Use: Never used  Substance Use Topics   Alcohol use: Not Currently   Drug use: Not Currently    Types: Marijuana    Comment: quit 2 days ago     Allergies: No Known Allergies  Medications Prior to Admission  Medication Sig Dispense Refill Last Dose   Doxylamine-Pyridoxine (DICLEGIS) 10-10 MG TBEC Take 2 tablets by mouth at bedtime. 100 tablet 2    ibuprofen (ADVIL) 600 MG tablet Take 1 tablet (600 mg total) by mouth every 6 (six) hours as needed for up to 30 doses for moderate pain or cramping. (Patient not taking: Reported on 10/23/2021) 30 tablet 0    misoprostol (CYTOTEC) 200 MCG tablet Take 4 tablets (800 mcg total) by mouth once for 1 dose. 4 tablet 0    Prenat-Fe Poly-Methfol-FA-DHA (VITAFOL ULTRA) 29-0.6-0.4-200 MG CAPS Take 1 tablet by mouth daily. 30  capsule 12    promethazine (PHENERGAN) 12.5 MG tablet Take 1 tablet (12.5 mg total) by mouth every 6 (six) hours as needed for nausea or vomiting. (Patient not taking: Reported on 10/23/2021) 30 tablet 0     Review of Systems  Constitutional:  Negative for chills, fatigue and fever.  Respiratory:  Negative for apnea, shortness of breath and wheezing.   Cardiovascular:  Negative for chest pain and palpitations.  Gastrointestinal:  Negative for abdominal pain, constipation, diarrhea, nausea and vomiting.  Genitourinary:  Positive for pelvic pain and vaginal pain. Negative for difficulty urinating, dysuria, flank pain, vaginal bleeding and vaginal discharge.  Musculoskeletal:  Negative for back pain.  Neurological:  Negative for seizures, weakness and headaches.  Psychiatric/Behavioral:  Negative for suicidal ideas. The patient is nervous/anxious.    Physical Exam   Blood pressure 115/66, pulse 62, temperature 98.4 F (36.9 C), resp. rate 17, height 5\' 4"  (1.626 m), weight 78.5 kg, last menstrual period 09/06/2021, SpO2 100 %, unknown if currently breastfeeding.  Physical Exam Vitals (Patient became tearful with results.) and nursing note  reviewed.  Constitutional:      General: She is not in acute distress.    Appearance: Normal appearance.  HENT:     Head: Normocephalic.  Pulmonary:     Effort: Pulmonary effort is normal.  Musculoskeletal:     Cervical back: Normal range of motion.  Skin:    General: Skin is warm and dry.  Neurological:     Mental Status: She is alert and oriented to person, place, and time.  Psychiatric:        Mood and Affect: Mood normal.     MAU Course  Procedures Orders Placed This Encounter  Procedures   Wet prep, genital   US OB Comp Less 14 Wks   CBC   Comprehensive metabolic panel   hCG, quantitative, pregnancy   Urinalysis, Routine w reflex microscopic Urine, Clean Catch   Ambulatory referral to [facility]   Discharge patient    Results for orders placed or performed during the hospital encounter of 10/31/21 (from the past 24 hour(s))  CBC     Status: Abnormal   Collection Time: 10/31/21  9:29 PM  Result Value Ref Range   WBC 7.2 4.0 - 10.5 K/uL   RBC 4.00 3.87 - 5.11 MIL/uL   Hemoglobin 12.6 12.0 - 15.0 g/dL   HCT 09.3 (L) 23.5 - 57.3 %   MCV 88.5 80.0 - 100.0 fL   MCH 31.5 26.0 - 34.0 pg   MCHC 35.6 30.0 - 36.0 g/dL   RDW 22.0 25.4 - 27.0 %   Platelets 198 150 - 400 K/uL   nRBC 0.0 0.0 - 0.2 %  Comprehensive metabolic panel     Status: Abnormal   Collection Time: 10/31/21  9:29 PM  Result Value Ref Range   Sodium 137 135 - 145 mmol/L   Potassium 3.8 3.5 - 5.1 mmol/L   Chloride 107 98 - 111 mmol/L   CO2 23 22 - 32 mmol/L   Glucose, Bld 105 (H) 70 - 99 mg/dL   BUN 7 6 - 20 mg/dL   Creatinine, Ser 6.23 0.44 - 1.00 mg/dL   Calcium 8.8 (L) 8.9 - 10.3 mg/dL   Total Protein 6.7 6.5 - 8.1 g/dL   Albumin 3.7 3.5 - 5.0 g/dL   AST 14 (L) 15 - 41 U/L   ALT 15 0 - 44 U/L   Alkaline Phosphatase 45 38 - 126 U/L   Total Bilirubin 0.3 0.3 - 1.2 mg/dL   GFR, Estimated >76 >28 mL/min   Anion gap 7 5 - 15  hCG, quantitative, pregnancy     Status: Abnormal   Collection Time: 10/31/21  9:29 PM  Result Value Ref Range   hCG, Beta Chain, Quant, S 104,197 (H) <5 mIU/mL  Wet prep, genital     Status: None   Collection Time: 10/31/21 11:10 PM   Specimen: PATH Cytology Cervicovaginal Ancillary Only  Result Value Ref Range   Yeast Wet Prep HPF POC NONE SEEN NONE SEEN   Trich, Wet Prep NONE SEEN NONE SEEN   Clue Cells Wet Prep HPF POC NONE SEEN NONE SEEN   WBC, Wet Prep HPF POC <10 <10   Sperm NONE SEEN   Urinalysis, Routine w reflex microscopic Urine, Clean Catch     Status: None   Collection Time: 10/31/21 11:25 PM  Result Value Ref Range   Color, Urine YELLOW YELLOW   APPearance CLEAR CLEAR   Specific Gravity, Urine 1.021 1.005 - 1.030   pH 6.0 5.0 -  8.0   Glucose, UA NEGATIVE NEGATIVE mg/dL   Hgb urine  dipstick NEGATIVE NEGATIVE   Bilirubin Urine NEGATIVE NEGATIVE   Ketones, ur NEGATIVE NEGATIVE mg/dL   Protein, ur NEGATIVE NEGATIVE mg/dL   Nitrite NEGATIVE NEGATIVE   Leukocytes,Ua NEGATIVE NEGATIVE   US OB Comp Less 14 Wks  Result Date: 11/01/2021 CLINICAL DATA:  Left-sided pelvic pain and positive pregnancy test EXAM: OBSTETRIC <14 WK ULTRASOUND TECHNIQUE: Transabdominal ultrasound was performed for evaluation of the gestation as well as the maternal uterus and adnexal regions. COMPARISON:  None Available. FINDINGS: Intrauterine gestational sac: Present Yolk sac:  Present Embryo:  Present Cardiac Activity: Present Heart Rate: 165 bpm CRL:   15.4 mm   7 w 6 d                  [place] EDC: 06/13/2022 Subchorionic hemorrhage: Small subchorionic hemorrhage is noted measuring up to 2.1 cm. Maternal uterus/adnexae: Right ovary is not well visualized. Left ovary is within normal limits. IMPRESSION: Single live intrauterine gestation at 7 weeks 6 days. Small subchorionic hemorrhage is noted as described. Electronically Signed   By: Alcide Clever M.D.   On: 11/01/2021 00:00    Pain medication offered; patient declined.   MDM Wet Prep normal  UA normal Labs unremarkable  Single living IUP consistent with dates observed ~8 weeks 0 days.   Small Subchorionic hemorrhage noted.  Low suspicion for ectopic  MAU attending In department and agrees with plan of care.  Plan for discharge.   Assessment and Plan   1. IUP (intrauterine pregnancy), incidental   2. [redacted] weeks gestation of pregnancy   3. Subchorionic hematoma in first trimester, single or unspecified fetus    - Discussed that this is a normal IUP with cardiac activity. Patient very tearful when discussing results. Is unable to enjoy pregnancy due to history of SAB. Referral to [facility] placed.  - Discussed [facility] and bleeding precautions and expectation reviewed.  - Recommended PO Tylenol for pain if needed. Admin instruction provided at bedside.  -  Patient already had NOB intake scheduled with [facility]. Encouraged to keep appointments.  - Early pregnancy bleeding and return precautions reviewed.  - Patient discharged home in stable condition from MAU. Patient may return to MAU as needed.    Claudette Head, MSN CNM  11/01/2021, 12:15 AM

## 2021-11-01 NOTE — Progress Notes (Signed)
Shay Payne CNM in earlier to discuss test results and d/c plan. Written and verbal d/c instructions given and understanding voiced. 

## 2021-11-13 ENCOUNTER — Ambulatory Visit: Payer: Self-pay

## 2021-11-13 ENCOUNTER — Ambulatory Visit (INDEPENDENT_AMBULATORY_CARE_PROVIDER_SITE_OTHER): Payer: Medicaid Other

## 2021-11-13 DIAGNOSIS — Z3480 Encounter for supervision of other normal pregnancy, unspecified trimester: Secondary | ICD-10-CM | POA: Insufficient documentation

## 2021-11-13 MED ORDER — BLOOD PRESSURE KIT DEVI: 1.0000 | 0 refills | Status: DC

## 2021-11-13 MED ORDER — GOJJI WEIGHT SCALE MISC: 1.0000 | 0 refills | Status: DC

## 2021-11-13 NOTE — Progress Notes (Signed)
New OB Intake  I connected with  Beverly Shah on 11/13/21 at  3:10 PM EDT by telephone Video Visit and verified that I am speaking with the correct person using two identifiers. Nurse is located at [facility] and pt is located at Home.  I discussed the limitations, risks, security and privacy concerns of performing an evaluation and management service by telephone and the availability of in person appointments. I also discussed with the patient that there may be a patient responsible charge related to this service. The patient expressed understanding and agreed to proceed.  I explained I am completing New OB Intake today. We discussed her EDD of 06/13/22 that is based on LMP of 09/06/21. Pt is G3/P1011. I reviewed her allergies, medications, Medical/Surgical/OB history, and appropriate screenings. I informed her of [facility] services. [facility] information placed in AVS. Based on history, this is a/an  pregnancy uncomplicated .   Patient Active Problem List   Diagnosis Date Noted   Vacuum-assisted vaginal delivery 09/15/2018   COVID-19 virus detected 09/14/2018   Postpartum care following vaginal delivery 02/17/2018    Concerns addressed today  Delivery Plans Plans to deliver at [facility]. Patient given information for [facility] Healthy Baby website for more information about [facility]. Patient is interested in water birth. Offered upcoming OB visit with CNM to discuss further.  MyChart/Babyscripts MyChart access verified. I explained pt will have some visits in office and some virtually. Babyscripts instructions given and order placed. Patient verifies receipt of registration text/e-mail. Account successfully created and app downloaded.  Blood Pressure Cuff/Weight Scale Blood pressure cuff ordered for patient to pick-up from [employer]. Explained after first prenatal appt pt will check weekly and document in Babyscripts. Patient does not  have weight scale. Weight scale  ordered for patient to pick up from [employer].   Anatomy US Explained first scheduled [place] will be around 19 weeks. Anatomy US ordered. Date TBD. Labs Discussed Avelina Laine genetic screening with patient. Would like both Panorama and Horizon drawn at new OB visit. Routine prenatal labs needed.  Covid Vaccine Patient has not covid vaccine.   Is patient a CenteringPregnancy candidate?  Declined Declined due to Group Setting Not a candidate due to  Centering Patient" indicated on sticky note  Social Determinants of Health Food Insecurity: Patient denies food insecurity. WIC Referral: Patient is interested in referral to [facility].  Transportation: Patient denies transportation needs. Childcare: Discussed no children allowed at ultrasound appointments. Offered childcare services; patient declines childcare services at this time.  First visit review I reviewed new OB appt with pt. I explained she will have a provider visit that includes prenatal labs, pap smear, genetic screening, and discuss plan of care for pregnancy.. Explained pt will be seen by Peggy Constant at first visit; encounter routed to appropriate provider. Explained that patient will be seen by pregnancy navigator following visit with provider.   Hamilton Capri, RN 11/13/2021  2:39 PM

## 2021-12-11 ENCOUNTER — Ambulatory Visit (INDEPENDENT_AMBULATORY_CARE_PROVIDER_SITE_OTHER): Payer: Medicaid Other | Admitting: Obstetrics and Gynecology

## 2021-12-11 ENCOUNTER — Encounter: Payer: Self-pay | Admitting: Obstetrics and Gynecology

## 2021-12-11 ENCOUNTER — Other Ambulatory Visit (HOSPITAL_COMMUNITY)
Admission: RE | Admit: 2021-12-11 | Discharge: 2021-12-11 | Disposition: A | Discharge status: Home / Self Care | Payer: Medicaid Other | Source: Ambulatory Visit | Attending: Obstetrics and Gynecology | Admitting: Obstetrics and Gynecology

## 2021-12-11 VITALS — BP 117/69 | HR 80 | Wt 170.0 lb

## 2021-12-11 DIAGNOSIS — Z3480 Encounter for supervision of other normal pregnancy, unspecified trimester: Secondary | ICD-10-CM | POA: Diagnosis not present

## 2021-12-11 DIAGNOSIS — Z3A13 13 weeks gestation of pregnancy: Secondary | ICD-10-CM | POA: Diagnosis not present

## 2021-12-11 DIAGNOSIS — Z131 Encounter for screening for diabetes mellitus: Secondary | ICD-10-CM | POA: Diagnosis not present

## 2021-12-11 DIAGNOSIS — Z3482 Encounter for supervision of other normal pregnancy, second trimester: Secondary | ICD-10-CM | POA: Diagnosis not present

## 2021-12-11 DIAGNOSIS — Z3481 Encounter for supervision of other normal pregnancy, first trimester: Secondary | ICD-10-CM

## 2021-12-11 NOTE — Patient Instructions (Signed)

## 2021-12-11 NOTE — Progress Notes (Signed)
  Subjective:    Bleu Moisan is a [id number] [redacted]w[redacted]d being seen today for her first obstetrical visit.  Her obstetrical history is significant for previous uncomplicated pregnancy. Patient does intend to breast feed. Pregnancy history fully reviewed.  Patient reports nausea.  Vitals:   12/11/21 1446  BP: 117/69  Pulse: 80  Weight: 170 lb (77.1 kg)    HISTORY: OB History  Gravida Para Term Preterm AB Living  3 1 1   1 1   SAB IAB Ectopic Multiple Live Births  1     0 1    # Outcome Date GA Lbr Len/2nd Weight Sex Delivery Anes PTL Lv  3 Current           2 SAB 07/2019 [redacted]w[redacted]d         1 Term 09/15/18 [redacted]w[redacted]d 05:28 / 06:21 6 lb 12.1 oz (3.065 kg) M Vag-Vacuum EPI  LIV     Birth Comments: moulding; three small skin tags in front of right ear   Past Medical History:  Diagnosis Date  . Medical history non-contributory    Past Surgical History:  Procedure Laterality Date  . NO PAST SURGERIES     No family history on file.   Exam    Uterus:   14-weeks  Pelvic Exam:    Perineum: Normal Perineum   Vulva: normal   Vagina:  normal mucosa, normal discharge   pH:    Cervix: multiparous appearance and cervix is closed and long   Adnexa: normal adnexa and no mass, fullness, tenderness   Bony Pelvis: gynecoid  System: Breast:  normal appearance, no masses or tenderness   Skin: normal coloration and turgor, no rashes    Neurologic: oriented, no focal deficits   Extremities: normal strength, tone, and muscle mass   HEENT extra ocular movement intact   Mouth/Teeth mucous membranes moist, pharynx normal without lesions and dental hygiene good   Neck supple and no masses   Cardiovascular: regular rate and rhythm   Respiratory:  appears well, vitals normal, no respiratory distress, acyanotic, normal RR, chest clear, no wheezing, crepitations, rhonchi, normal symmetric air entry   Abdomen: soft, non-tender; bowel sounds normal; no masses,  no organomegaly   Urinary:        Assessment:    Pregnancy: G3P1011 Patient Active Problem List   Diagnosis Date Noted  . Supervision of other normal pregnancy, antepartum 11/13/2021  . Vacuum-assisted vaginal delivery 09/15/2018  . COVID-19 virus detected 09/14/2018  . Postpartum care following vaginal delivery 02/17/2018        Plan:     Initial labs drawn. Prenatal vitamins. Problem list reviewed and updated. Genetic Screening discussed : panorama ordered.  Ultrasound discussed; fetal survey: ordered.  Follow up in 4 weeks. 50% of 30 min visit spent on counseling and coordination of care.     Rushi Chasen 12/11/2021

## 2021-12-11 NOTE — Progress Notes (Signed)
Pt states she is having some constipation and gas pains.  Pt states she recently had some greenish d/c when wiping.

## 2021-12-12 LAB — HEMOGLOBIN A1C
Est. average glucose Bld gHb Est-mCnc: 97 mg/dL
Hgb A1c MFr Bld: 5 % (ref 4.8–5.6)

## 2021-12-12 LAB — CBC/D/PLT+RPR+RH+ABO+RUBIGG...
Antibody Screen: NEGATIVE
Basophils Absolute: 0.1 10*3/uL (ref 0.0–0.2)
Basos: 1 %
EOS (ABSOLUTE): 0.1 10*3/uL (ref 0.0–0.4)
Eos: 1 %
HCV Ab: NONREACTIVE
HIV Screen 4th Generation wRfx: NONREACTIVE
Hematocrit: 38.5 % (ref 34.0–46.6)
Hemoglobin: 13.1 g/dL (ref 11.1–15.9)
Hepatitis B Surface Ag: NEGATIVE
Immature Grans (Abs): 0.1 10*3/uL (ref 0.0–0.1)
Immature Granulocytes: 1 %
Lymphocytes Absolute: 1.9 10*3/uL (ref 0.7–3.1)
Lymphs: 23 %
MCH: 31 pg (ref 26.6–33.0)
MCHC: 34 g/dL (ref 31.5–35.7)
MCV: 91 fL (ref 79–97)
Monocytes Absolute: 0.5 10*3/uL (ref 0.1–0.9)
Monocytes: 6 %
Neutrophils Absolute: 5.8 10*3/uL (ref 1.4–7.0)
Neutrophils: 68 %
Platelets: 235 10*3/uL (ref 150–450)
RBC: 4.23 x10E6/uL (ref 3.77–5.28)
RDW: 12.4 % (ref 11.7–15.4)
RPR Ser Ql: NONREACTIVE
Rh Factor: POSITIVE
Rubella Antibodies, IGG: 1.11 index (ref >0.99)
WBC: 8.3 10*3/uL (ref 3.4–10.8)

## 2021-12-12 LAB — CERVICOVAGINAL ANCILLARY ONLY
Chlamydia: NEGATIVE
Comment: NEGATIVE
Comment: NORMAL
Neisseria Gonorrhea: NEGATIVE

## 2021-12-12 LAB — HCV INTERPRETATION

## 2021-12-13 LAB — URINE CULTURE, OB REFLEX

## 2021-12-13 LAB — CULTURE, OB URINE

## 2021-12-14 LAB — CYTOLOGY - PAP
Comment: NEGATIVE
Diagnosis: NEGATIVE
High risk HPV: NEGATIVE

## 2021-12-16 LAB — PANORAMA PRENATAL TEST FULL PANEL:PANORAMA TEST PLUS 5 ADDITIONAL MICRODELETIONS: FETAL FRACTION: 7.2

## 2022-01-08 ENCOUNTER — Encounter: Payer: Medicaid Other | Admitting: Advanced Practice Midwife

## 2022-01-16 ENCOUNTER — Encounter: Payer: Self-pay | Admitting: Student

## 2022-01-16 ENCOUNTER — Ambulatory Visit (INDEPENDENT_AMBULATORY_CARE_PROVIDER_SITE_OTHER): Payer: Medicaid Other | Admitting: Student

## 2022-01-16 VITALS — BP 109/67 | HR 77 | Wt 182.0 lb

## 2022-01-16 DIAGNOSIS — O418X20 Other specified disorders of amniotic fluid and membranes, second trimester, not applicable or unspecified: Secondary | ICD-10-CM

## 2022-01-16 DIAGNOSIS — Z3A18 18 weeks gestation of pregnancy: Secondary | ICD-10-CM | POA: Diagnosis not present

## 2022-01-16 DIAGNOSIS — R519 Headache, unspecified: Secondary | ICD-10-CM

## 2022-01-16 DIAGNOSIS — O09292 Supervision of pregnancy with other poor reproductive or obstetric history, second trimester: Secondary | ICD-10-CM

## 2022-01-16 DIAGNOSIS — O09293 Supervision of pregnancy with other poor reproductive or obstetric history, third trimester: Secondary | ICD-10-CM

## 2022-01-16 DIAGNOSIS — Z3480 Encounter for supervision of other normal pregnancy, unspecified trimester: Secondary | ICD-10-CM

## 2022-01-16 DIAGNOSIS — O468X2 Other antepartum hemorrhage, second trimester: Secondary | ICD-10-CM

## 2022-01-16 DIAGNOSIS — O99891 Other specified diseases and conditions complicating pregnancy: Secondary | ICD-10-CM

## 2022-01-16 DIAGNOSIS — R252 Cramp and spasm: Secondary | ICD-10-CM

## 2022-01-16 DIAGNOSIS — Z3482 Encounter for supervision of other normal pregnancy, second trimester: Secondary | ICD-10-CM

## 2022-01-16 NOTE — Progress Notes (Signed)
PRENATAL VISIT NOTE  Subjective:  Beverly Shah is a 21 y.o. G3P1011 at [redacted]w[redacted]d being seen today for ongoing prenatal care.  She is currently monitored for the following issues for this low-risk pregnancy and has Postpartum care following vaginal delivery; COVID-19 virus detected; Vacuum-assisted vaginal delivery; and Supervision of other normal pregnancy, antepartum on their problem list.  Patient reports headache and leg cramps. Headaches are intermittent and located in back-lower region of head. Describes leg cramps while laying down or after sitting. Describes sometimes feeling like "pins and needles down one side of leg.  Contractions: Not present. Vag. Bleeding: None.  Movement: Present. Denies leaking of fluid.   The following portions of the patient's history were reviewed and updated as appropriate: allergies, current medications, past family history, past medical history, past social history, past surgical history and problem list.   Objective:   Vitals:   01/16/22 1000  BP: 109/67  Pulse: 77  Weight: 182 lb (82.6 kg)    Fetal Status: Fetal Heart Rate (bpm): 139   Movement: Present     General:  Alert, oriented and cooperative. Patient is in no acute distress.  Skin: Skin is warm and dry. No rash noted.   Cardiovascular: Normal heart rate noted  Respiratory: Normal respiratory effort, no problems with respiration noted  Abdomen: Soft, gravid, appropriate for gestational age.  Pain/Pressure: Present     Pelvic: Cervical exam deferred        Extremities: Normal range of motion.  Edema: None  Mental Status: Normal mood and affect. Normal behavior. Normal judgment and thought content.   Assessment and Plan:  Pregnancy: G3P1011 at [redacted]w[redacted]d 1. Supervision of other normal pregnancy, antepartum - Doing well, baby flutters present - Discussed benefits of exclusive breastfeeding for first 6 weeks of postpartum period   2. [redacted] weeks gestation of pregnancy - Routine  follow-up in 1 month, unless clinically indicated - AFP, Serum, Open Spina Bifida  3. Subchorionic hematoma in second trimester, single or unspecified fetus - [place] scan tomorrow.   4. History of miscarriage, currently pregnant, third trimester - Appropriate affect today  5. Generalized headaches - Normotensive. Encouraged increase water intake from 2 bottles/ day to 4-6 bottles/day. Discussed precautionary symptoms to report to provider or seek medical attention for, such as blurred vision, dizziness, epigastric pain, swelling in extremities, or general malaise should headaches continue or worsen later into pregnancy. Discussed safety of Tylenol in pregnancy. Patient declined any medical management at this time. - CBC - Comprehensive metabolic panel  6. Leg cramps in pregnancy - Provided reassurance that this is normal symptom in pregnancy. CBC collected to rule out anemia as cause. Discussed benefits of increased hydration and regular activity. Discussed options for vitamin or mineral supplementation, patient declined. Patient to inform medical provider if desiring further comfort measures. Recommended looking into pregnancy belt for added comfort in third trimester to alleviate pressure on back, hips, pelvis and leg regions.  Preterm labor symptoms and general obstetric precautions including but not limited to vaginal bleeding, contractions, leaking of fluid and fetal movement were reviewed in detail with the patient. Please refer to After Visit Summary for other counseling recommendations.   Return in about 4 weeks (around 02/13/2022) for IN-PERSON, LOB.  Future Appointments  Date Time Provider [place]  01/17/2022  1:30 PM [facility] NURSE [facility]  01/17/2022  1:45 PM [facility] US5 [facility]  02/13/2022  2:50 PM Gavin Pound, CNM [facility] None    Johnston Ebbs, NP

## 2022-01-16 NOTE — Progress Notes (Signed)
Patient presents for [place] visit. Pt reports having a sharp pain that shoots down her left leg. Also has bad headaches at times and nuimbness/tingling in her fingers.

## 2022-01-17 ENCOUNTER — Ambulatory Visit: Payer: Medicaid Other

## 2022-01-18 LAB — COMPREHENSIVE METABOLIC PANEL
ALT: 14 IU/L (ref 0–32)
AST: 15 IU/L (ref 0–40)
Albumin/Globulin Ratio: 1.4 (ref 1.2–2.2)
Albumin: 3.7 g/dL — ABNORMAL LOW (ref 4.0–5.0)
Alkaline Phosphatase: 63 IU/L (ref 44–121)
BUN/Creatinine Ratio: 13 (ref 9–23)
BUN: 7 mg/dL (ref 6–20)
Bilirubin Total: 0.3 mg/dL (ref 0.0–1.2)
CO2: 20 mmol/L (ref 20–29)
Calcium: 8.7 mg/dL (ref 8.7–10.2)
Chloride: 103 mmol/L (ref 96–106)
Creatinine, Ser: 0.56 mg/dL — ABNORMAL LOW (ref 0.57–1.00)
Globulin, Total: 2.7 g/dL (ref 1.5–4.5)
Glucose: 82 mg/dL (ref 70–99)
Potassium: 4.1 mmol/L (ref 3.5–5.2)
Sodium: 136 mmol/L (ref 134–144)
Total Protein: 6.4 g/dL (ref 6.0–8.5)
eGFR: 132 mL/min/{1.73_m2} (ref >59)

## 2022-01-18 LAB — CBC
Hematocrit: 37.2 % (ref 34.0–46.6)
Hemoglobin: 12.7 g/dL (ref 11.1–15.9)
MCH: 31.1 pg (ref 26.6–33.0)
MCHC: 34.1 g/dL (ref 31.5–35.7)
MCV: 91 fL (ref 79–97)
Platelets: 219 10*3/uL (ref 150–450)
RBC: 4.08 x10E6/uL (ref 3.77–5.28)
RDW: 11.9 % (ref 11.7–15.4)
WBC: 8.1 10*3/uL (ref 3.4–10.8)

## 2022-01-18 LAB — AFP, SERUM, OPEN SPINA BIFIDA
AFP MoM: 1.09
AFP Value: 42.6 ng/mL
Gest. Age on Collection Date: 18 weeks
Maternal Age At EDD: 22.7 yr
OSBR Risk 1 IN: 9231
Test Results:: NEGATIVE
Weight: 182 [lb_av]

## 2022-02-19 ENCOUNTER — Ambulatory Visit: Payer: Medicaid Other

## 2022-02-19 ENCOUNTER — Ambulatory Visit: Payer: Medicaid Other | Attending: Obstetrics and Gynecology

## 2022-02-19 DIAGNOSIS — Z363 Encounter for antenatal screening for malformations: Secondary | ICD-10-CM | POA: Insufficient documentation

## 2022-02-19 DIAGNOSIS — Z3480 Encounter for supervision of other normal pregnancy, unspecified trimester: Secondary | ICD-10-CM | POA: Insufficient documentation

## 2022-02-19 DIAGNOSIS — Z3689 Encounter for other specified antenatal screening: Secondary | ICD-10-CM | POA: Diagnosis not present

## 2022-02-19 DIAGNOSIS — Z3A23 23 weeks gestation of pregnancy: Secondary | ICD-10-CM | POA: Insufficient documentation

## 2022-03-01 ENCOUNTER — Encounter: Payer: Medicaid Other | Admitting: Obstetrics and Gynecology

## 2022-03-14 ENCOUNTER — Encounter: Payer: Medicaid Other | Admitting: Student

## 2022-03-19 NOTE — L&D Delivery Note (Signed)
Delivery Note Beverly Shah is a 22 y.o. G3P1011 at [redacted]w[redacted]d admitted for IOL for postdates.   GBS Status:  Positive/-- (03/04 1615)  Labor course: Initial SVE: 2/50/-2. Augmentation with: AROM, Pitocin, Cytotec, and IP Foley. She then progressed to complete.  ROM: 1h 37m with clear fluid  Birth: Delivery of a Live born female  Birth Weight:   APGAR: ,   Newborn Delivery   Birth date/time: 06/22/2022 01:20:55 Delivery type: Vaginal, Spontaneous         Delivered via spontaneous vaginal delivery (Presentation: ROA ). Nuchal cord present: No.  Shoulders and body delivered in usual fashion. Infant placed directly on mom's abdomen for bonding/skin-to-skin, baby dried and stimulated. Cord clamped x 2 after 1 minute and cut by FOB.  Cord blood collected. Baby was a little floppy at first, so ABG collected, pH 7.06.  Placenta delivered-Spontaneous  with 3 vessels . 20u Pitocin in 500cc LR given as a bolus prior delivery of placenta.  Fundus firm with massage. Placenta inspected and appears to be intact with a 3 VC.  Sponge and instrument count were correct x2.  Intrapartum complications:  none Anesthesia:  none Lacerations:  none Suture Repair:  EBL (mL):228.00     Mom to postpartum.  Baby to Couplet care / Skin to Skin. Placenta to L&D   Plans to Breast and bottlefeed Contraception: none Circumcision: declines  Note sent to [facility]: [facility] for pp visit.  Delivery Report:  Review the Delivery Report for details.     Signed: Jacklyn Shell, DNP,CNM 06/22/2022, 1:42 AM

## 2022-03-25 ENCOUNTER — Encounter: Payer: Self-pay | Admitting: Student

## 2022-03-25 NOTE — Progress Notes (Signed)
   PRENATAL VISIT NOTE  Subjective:  Beverly Shah is a 22 y.o. G3P1011 at [redacted]w[redacted]d being seen today for ongoing prenatal care.  She is currently monitored for the following issues for this low-risk pregnancy and has Supervision of other normal pregnancy, antepartum on their problem list.  Patient reports {sx:14538}.   .  .   . Denies leaking of fluid.   The following portions of the patient's history were reviewed and updated as appropriate: allergies, current medications, past family history, past medical history, past social history, past surgical history and problem list.   Objective:  There were no vitals filed for this visit.  Fetal Status:           General:  Alert, oriented and cooperative. Patient is in no acute distress.  Skin: Skin is warm and dry. No rash noted.   Cardiovascular: Normal heart rate noted  Respiratory: Normal respiratory effort, no problems with respiration noted  Abdomen: Soft, gravid, appropriate for gestational age.        Pelvic: {Blank single:19197::"Cervical exam performed in the presence of a chaperone","Cervical exam deferred"}        Extremities: Normal range of motion.     Mental Status: Normal mood and affect. Normal behavior. Normal judgment and thought content.   Assessment and Plan:  Pregnancy: G3P1011 at [redacted]w[redacted]d 1. Supervision of other normal pregnancy, antepartum ***  2. [redacted] weeks gestation of pregnancy - third trimester labs collected today  3. Generalized headaches ***  4. History of miscarriage, currently pregnant, third trimester ***  Preterm labor symptoms and general obstetric precautions including but not limited to vaginal bleeding, contractions, leaking of fluid and fetal movement were reviewed in detail with the patient. Please refer to After Visit Summary for other counseling recommendations.   No follow-ups on file.  Future Appointments  Date Time Provider [place]  03/26/2022  9:15 AM [facility]  None  03/26/2022 10:55 AM Johnston Ebbs, NP [facility] None    Johnston Ebbs, NP

## 2022-03-26 ENCOUNTER — Other Ambulatory Visit: Payer: Medicaid Other

## 2022-03-26 ENCOUNTER — Ambulatory Visit (INDEPENDENT_AMBULATORY_CARE_PROVIDER_SITE_OTHER): Payer: Medicaid Other | Admitting: Student

## 2022-03-26 ENCOUNTER — Encounter: Payer: Self-pay | Admitting: Student

## 2022-03-26 VITALS — BP 120/71 | HR 93 | Wt 202.2 lb

## 2022-03-26 DIAGNOSIS — K30 Functional dyspepsia: Secondary | ICD-10-CM

## 2022-03-26 DIAGNOSIS — R519 Headache, unspecified: Secondary | ICD-10-CM

## 2022-03-26 DIAGNOSIS — O09293 Supervision of pregnancy with other poor reproductive or obstetric history, third trimester: Secondary | ICD-10-CM

## 2022-03-26 DIAGNOSIS — Z3A28 28 weeks gestation of pregnancy: Secondary | ICD-10-CM

## 2022-03-26 DIAGNOSIS — Z3483 Encounter for supervision of other normal pregnancy, third trimester: Secondary | ICD-10-CM

## 2022-03-26 DIAGNOSIS — Z3480 Encounter for supervision of other normal pregnancy, unspecified trimester: Secondary | ICD-10-CM

## 2022-03-26 DIAGNOSIS — Z3493 Encounter for supervision of normal pregnancy, unspecified, third trimester: Secondary | ICD-10-CM | POA: Diagnosis not present

## 2022-03-26 MED ORDER — VITAFOL ULTRA 29-0.6-0.4-200 MG PO CAPS: 1.0000 | ORAL_CAPSULE | Freq: Every day | ORAL | 12 refills | Status: AC

## 2022-03-26 NOTE — Patient Instructions (Signed)

## 2022-03-26 NOTE — Progress Notes (Signed)
Pt requesting Rx for heartburn.

## 2022-03-27 LAB — CBC
Hematocrit: 36.6 % (ref 34.0–46.6)
Hemoglobin: 12.3 g/dL (ref 11.1–15.9)
MCH: 31.3 pg (ref 26.6–33.0)
MCHC: 33.6 g/dL (ref 31.5–35.7)
MCV: 93 fL (ref 79–97)
Platelets: 218 10*3/uL (ref 150–450)
RBC: 3.93 x10E6/uL (ref 3.77–5.28)
RDW: 12.1 % (ref 11.7–15.4)
WBC: 12.1 10*3/uL — ABNORMAL HIGH (ref 3.4–10.8)

## 2022-03-27 LAB — GLUCOSE TOLERANCE, 2 HOURS W/ 1HR
Glucose, 1 hour: 133 mg/dL (ref 70–179)
Glucose, 2 hour: 84 mg/dL (ref 70–152)
Glucose, Fasting: 85 mg/dL (ref 70–91)

## 2022-03-27 LAB — RPR: RPR Ser Ql: NONREACTIVE

## 2022-03-27 LAB — HIV ANTIBODY (ROUTINE TESTING W REFLEX): HIV Screen 4th Generation wRfx: NONREACTIVE

## 2022-04-09 ENCOUNTER — Encounter: Payer: Self-pay | Admitting: Advanced Practice Midwife

## 2022-04-09 ENCOUNTER — Telehealth (INDEPENDENT_AMBULATORY_CARE_PROVIDER_SITE_OTHER): Payer: Medicaid Other | Admitting: Advanced Practice Midwife

## 2022-04-09 DIAGNOSIS — O26893 Other specified pregnancy related conditions, third trimester: Secondary | ICD-10-CM

## 2022-04-09 DIAGNOSIS — Z3480 Encounter for supervision of other normal pregnancy, unspecified trimester: Secondary | ICD-10-CM

## 2022-04-09 DIAGNOSIS — Z3A30 30 weeks gestation of pregnancy: Secondary | ICD-10-CM

## 2022-04-09 DIAGNOSIS — R102 Pelvic and perineal pain: Secondary | ICD-10-CM

## 2022-04-09 MED ORDER — BLOOD PRESSURE KIT DEVI: 1.0000 | 0 refills | Status: AC

## 2022-04-09 MED ORDER — GOJJI WEIGHT SCALE MISC: 1.0000 | 0 refills | Status: AC

## 2022-04-09 NOTE — Progress Notes (Signed)
Virtual Visit via Telephone Note  I connected with Beverly Shah on 04/09/22 at  2:50 PM EST by telephone and verified that I am speaking with the correct person using two identifiers.  Location: Patient: home Provider: [facility]   Unable to check BP and weight. Pt did not pick them up from the pharmacy yet.

## 2022-04-09 NOTE — Progress Notes (Signed)
    TELEHEALTH OBSTETRICS VISIT ENCOUNTER NOTE  Provider location: Center for [place] at [facility]   Patient location: Home  I connected with Beverly Shah on 04/09/22 at  2:50 PM EST by telephone at home and verified that I am speaking with the correct person using two identifiers. Of note, unable to do video encounter due to technical difficulties.    I discussed the limitations, risks, security and privacy concerns of performing an evaluation and management service by telephone and the availability of in person appointments. I also discussed with the patient that there may be a patient responsible charge related to this service. The patient expressed understanding and agreed to proceed.  Subjective:  Beverly Shah is a 22 y.o. G3P1011 at [redacted]w[redacted]d being followed for ongoing prenatal care.  She is currently monitored for the following issues for this low-risk pregnancy and has Supervision of other normal pregnancy, antepartum on their problem list.  Patient reports  pelvic/thigh pain in pregnancy . Reports fetal movement. Denies any contractions, bleeding or leaking of fluid.   The following portions of the patient's history were reviewed and updated as appropriate: allergies, current medications, past family history, past medical history, past social history, past surgical history and problem list.   Objective:  Last menstrual period 09/06/2021, unknown if currently breastfeeding. General:  Alert, oriented and cooperative.   Mental Status: Normal mood and affect perceived. Normal judgment and thought content.  Rest of physical exam deferred due to type of encounter  Assessment and Plan:  Pregnancy: G3P1011 at [redacted]w[redacted]d 1. Supervision of other normal pregnancy, antepartum --Pt reports good fetal movement, denies cramping, LOF, or vaginal bleeding  --Anticipatory guidance about next visits/weeks of pregnancy given.   - Blood Pressure Monitoring (BLOOD PRESSURE  KIT) DEVI; 1 kit by Does not apply route once a week.  Dispense: 1 each; Refill: 0 - Misc. Devices (GOJJI WEIGHT SCALE) MISC; 1 Device by Does not apply route every 30 (thirty) days.  Dispense: 1 each; Refill: 0  2. Pelvic pain affecting pregnancy in third trimester, antepartum --Rest/ice/heat/warm bath/increase PO fluids/Tylenol/pregnancy support belt  --Pt to try magnesium at bedtime for aches and pains, aid with sleep, and constipation.    3. [redacted] weeks gestation of pregnancy    Preterm labor symptoms and general obstetric precautions including but not limited to vaginal bleeding, contractions, leaking of fluid and fetal movement were reviewed in detail with the patient.  I discussed the assessment and treatment plan with the patient. The patient was provided an opportunity to ask questions and all were answered. The patient agreed with the plan and demonstrated an understanding of the instructions. The patient was advised to call back or seek an in-person office evaluation/go to MAU at [facility] for any urgent or concerning symptoms. Please refer to After Visit Summary for other counseling recommendations.   I provided 10 minutes of non-face-to-face time during this encounter.  Return in about 2 weeks (around 04/23/2022) for Pt prefers virtual appointment, female provider.    Fatima Blank, [place] for [employer], [place]

## 2022-04-23 ENCOUNTER — Telehealth (INDEPENDENT_AMBULATORY_CARE_PROVIDER_SITE_OTHER): Payer: Medicaid Other | Admitting: Obstetrics and Gynecology

## 2022-04-23 ENCOUNTER — Encounter: Payer: Self-pay | Admitting: Obstetrics and Gynecology

## 2022-04-23 VITALS — BP 118/81 | HR 83 | Wt 213.0 lb

## 2022-04-23 DIAGNOSIS — Z3483 Encounter for supervision of other normal pregnancy, third trimester: Secondary | ICD-10-CM

## 2022-04-23 DIAGNOSIS — Z3480 Encounter for supervision of other normal pregnancy, unspecified trimester: Secondary | ICD-10-CM

## 2022-04-23 DIAGNOSIS — Z3A32 32 weeks gestation of pregnancy: Secondary | ICD-10-CM

## 2022-04-23 NOTE — Progress Notes (Signed)
I connected with Beverly Shah on 04/23/22 at  1:50 PM EST by telephone and verified that I am speaking with the correct person using two identifiers. Pt complains of facial swelling. No other concerns at this time.

## 2022-04-23 NOTE — Progress Notes (Signed)
   OBSTETRICS PRENATAL VIRTUAL VISIT ENCOUNTER NOTE  Provider location: Center for [place] at [facility]   Patient location: Home  I connected with Beverly Shah on 04/23/22 at  1:50 PM EST by MyChart Video Encounter and verified that I am speaking with the correct person using two identifiers. I discussed the limitations, risks, security and privacy concerns of performing an evaluation and management service virtually and the availability of in person appointments. I also discussed with the patient that there may be a patient responsible charge related to this service. The patient expressed understanding and agreed to proceed. Subjective:  Beverly Shah is a 22 y.o. G3P1011 at [redacted]w[redacted]d being seen today for ongoing prenatal care.  She is currently monitored for the following issues for this low-risk pregnancy and has Supervision of other normal pregnancy, antepartum on their problem list.  Patient reports no complaints.  Contractions: Not present. Vag. Bleeding: None.  Movement: Present. Denies any leaking of fluid.   The following portions of the patient's history were reviewed and updated as appropriate: allergies, current medications, past family history, past medical history, past social history, past surgical history and problem list.   Objective:   Vitals:   04/23/22 1327  BP: 118/81  Pulse: 83  Weight: 213 lb (96.6 kg)    Fetal Status:     Movement: Present     General:  Alert, oriented and cooperative. Patient is in no acute distress.  Respiratory: Normal respiratory effort, no problems with respiration noted  Mental Status: Normal mood and affect. Normal behavior. Normal judgment and thought content.  Rest of physical exam deferred due to type of encounter  Imaging: No results found.  Assessment and Plan:  Pregnancy: G3P1011 at [redacted]w[redacted]d 1. [redacted] weeks gestation of pregnancy   2. Supervision of other normal pregnancy, antepartum Continue routine  prenatal  Preterm labor symptoms and general obstetric precautions including but not limited to vaginal bleeding, contractions, leaking of fluid and fetal movement were reviewed in detail with the patient. I discussed the assessment and treatment plan with the patient. The patient was provided an opportunity to ask questions and all were answered. The patient agreed with the plan and demonstrated an understanding of the instructions. The patient was advised to call back or seek an in-person office evaluation/go to MAU at [facility] for any urgent or concerning symptoms. Please refer to After Visit Summary for other counseling recommendations.   I provided 10 minutes of face-to-face time during this encounter.  Return in about 2 weeks (around 05/07/2022) for ROB, in person.  Future Appointments  Date Time Provider [place]  04/23/2022  1:50 PM Griffin Basil, MD [place] None    Griffin Basil, MD Center for [facility], [place]

## 2022-04-30 ENCOUNTER — Telehealth: Payer: Self-pay | Admitting: *Deleted

## 2022-04-30 NOTE — Telephone Encounter (Signed)
TC from pt reporting HA, sore throat, feeling feverish. Asking if chamomile tea is OK. Advised that it is OK. Advised will send pregnancy safe meds list via MyChart. Pt verbalized understanding. Pt reporting round ligament pain symptoms. Advised pregnancy support belt. Advised to seek care if she is concerned for contractions, or bleeding or leaking of fluid from vagina.

## 2022-05-07 ENCOUNTER — Encounter: Payer: Medicaid Other | Admitting: Advanced Practice Midwife

## 2022-05-10 ENCOUNTER — Encounter: Payer: Medicaid Other | Admitting: Obstetrics

## 2022-05-18 DIAGNOSIS — Z3480 Encounter for supervision of other normal pregnancy, unspecified trimester: Secondary | ICD-10-CM | POA: Diagnosis not present

## 2022-05-21 ENCOUNTER — Other Ambulatory Visit (HOSPITAL_COMMUNITY)
Admission: RE | Admit: 2022-05-21 | Discharge: 2022-05-21 | Disposition: A | Discharge status: Home / Self Care | Payer: Medicaid Other | Source: Ambulatory Visit | Attending: Obstetrics and Gynecology | Admitting: Obstetrics and Gynecology

## 2022-05-21 ENCOUNTER — Ambulatory Visit (INDEPENDENT_AMBULATORY_CARE_PROVIDER_SITE_OTHER): Payer: Medicaid Other | Admitting: Obstetrics and Gynecology

## 2022-05-21 ENCOUNTER — Encounter: Payer: Self-pay | Admitting: Obstetrics and Gynecology

## 2022-05-21 VITALS — BP 101/64 | HR 93 | Wt 216.0 lb

## 2022-05-21 DIAGNOSIS — Z23 Encounter for immunization: Secondary | ICD-10-CM

## 2022-05-21 DIAGNOSIS — Z3480 Encounter for supervision of other normal pregnancy, unspecified trimester: Secondary | ICD-10-CM | POA: Diagnosis not present

## 2022-05-21 DIAGNOSIS — Z3483 Encounter for supervision of other normal pregnancy, third trimester: Secondary | ICD-10-CM

## 2022-05-21 DIAGNOSIS — Z3A36 36 weeks gestation of pregnancy: Secondary | ICD-10-CM

## 2022-05-21 MED ORDER — PANTOPRAZOLE SODIUM 40 MG PO TBEC: 40.0000 mg | DELAYED_RELEASE_TABLET | Freq: Every day | ORAL | 1 refills | Status: DC

## 2022-05-21 NOTE — Progress Notes (Signed)
ROB/GBS.  C/o groin pain 2-10/10.  TDAP given in LD, tolerated well.

## 2022-05-21 NOTE — Progress Notes (Signed)
   PRENATAL VISIT NOTE  Subjective:  Beverly Shah is a 22 y.o. G3P1011 at 34w5dbeing seen today for ongoing prenatal care.  She is currently monitored for the following issues for this low-risk pregnancy and has Supervision of other normal pregnancy, antepartum on their problem list.  Patient reports no complaints.  Contractions: Not present. Vag. Bleeding: None.  Movement: Present. Denies leaking of fluid.   The following portions of the patient's history were reviewed and updated as appropriate: allergies, current medications, past family history, past medical history, past social history, past surgical history and problem list.   Objective:   Vitals:   05/21/22 1507  BP: 101/64  Pulse: 93  Weight: 216 lb (98 kg)    Fetal Status: Fetal Heart Rate (bpm): 149 Fundal Height: 36 cm Movement: Present     General:  Alert, oriented and cooperative. Patient is in no acute distress.  Skin: Skin is warm and dry. No rash noted.   Cardiovascular: Normal heart rate noted  Respiratory: Normal respiratory effort, no problems with respiration noted  Abdomen: Soft, gravid, appropriate for gestational age.  Pain/Pressure: Present     Pelvic: Cervical exam performed in the presence of a chaperone Dilation: Closed Effacement (%): Thick Station: Ballotable  Extremities: Normal range of motion.  Edema: Trace  Mental Status: Normal mood and affect. Normal behavior. Normal judgment and thought content.   Assessment and Plan:  Pregnancy: G3P1011 at 333w5d. Supervision of other normal pregnancy, antepartum Patient is doing well without complaints Cultures collected Tdap today  Preterm labor symptoms and general obstetric precautions including but not limited to vaginal bleeding, contractions, leaking of fluid and fetal movement were reviewed in detail with the patient. Please refer to After Visit Summary for other counseling recommendations.   Return in about 1 week (around 05/28/2022)  for ROB, Low risk, virtual or in person (patient choice).  Future Appointments  Date Time Provider [place]3/01/2023  1:30 PM Alishba [place], PeVickii ChafeMD [facility]  06/04/2022  1:30 PM [place], LiKathie DikeCNM [facility] None  06/11/2022  1:30 PM FoInez CatalinaMD [facility] None    PeMora BellmanMD

## 2022-05-22 LAB — CERVICOVAGINAL ANCILLARY ONLY
Chlamydia: NEGATIVE
Comment: NEGATIVE
Comment: NORMAL
Neisseria Gonorrhea: NEGATIVE

## 2022-05-24 LAB — CULTURE, BETA STREP (GROUP B ONLY): Strep Gp B Culture: POSITIVE — AB

## 2022-05-25 ENCOUNTER — Encounter: Payer: Self-pay | Admitting: Obstetrics & Gynecology

## 2022-05-25 DIAGNOSIS — O99820 Streptococcus B carrier state complicating pregnancy: Secondary | ICD-10-CM | POA: Insufficient documentation

## 2022-05-28 ENCOUNTER — Encounter: Payer: Self-pay | Admitting: Obstetrics and Gynecology

## 2022-05-28 ENCOUNTER — Ambulatory Visit (INDEPENDENT_AMBULATORY_CARE_PROVIDER_SITE_OTHER): Payer: Medicaid Other | Admitting: Obstetrics and Gynecology

## 2022-05-28 VITALS — BP 96/65 | HR 88 | Wt 219.0 lb

## 2022-05-28 DIAGNOSIS — O99820 Streptococcus B carrier state complicating pregnancy: Secondary | ICD-10-CM

## 2022-05-28 DIAGNOSIS — Z3A37 37 weeks gestation of pregnancy: Secondary | ICD-10-CM

## 2022-05-28 DIAGNOSIS — Z3480 Encounter for supervision of other normal pregnancy, unspecified trimester: Secondary | ICD-10-CM

## 2022-05-28 NOTE — Progress Notes (Signed)
   PRENATAL VISIT NOTE  Subjective:  Beverly Shah is a 22 y.o. G3P1011 at [redacted]w[redacted]d being seen today for ongoing prenatal care.  She is currently monitored for the following issues for this low-risk pregnancy and has Supervision of other normal pregnancy, antepartum and Group B Streptococcus carrier, +RV culture, currently pregnant on their problem list.  Patient reports no complaints.  Contractions: Irritability. Vag. Bleeding: None.  Movement: Present. Denies leaking of fluid.   The following portions of the patient's history were reviewed and updated as appropriate: allergies, current medications, past family history, past medical history, past social history, past surgical history and problem list.   Objective:   Vitals:   05/28/22 1359  BP: 96/65  Pulse: 88  Weight: 219 lb (99.3 kg)    Fetal Status: Fetal Heart Rate (bpm): 150   Movement: Present     General:  Alert, oriented and cooperative. Patient is in no acute distress.  Skin: Skin is warm and dry. No rash noted.   Cardiovascular: Normal heart rate noted  Respiratory: Normal respiratory effort, no problems with respiration noted  Abdomen: Soft, gravid, appropriate for gestational age.  Pain/Pressure: Present     Pelvic: Cervical exam deferred        Extremities: Normal range of motion.  Edema: Trace  Mental Status: Normal mood and affect. Normal behavior. Normal judgment and thought content.   Assessment and Plan:  Pregnancy: G3P1011 at [redacted]w[redacted]d 1. Supervision of other normal pregnancy, antepartum Patient is doing well without complaints Discussed plan for IOL at 41 weeks to be scheduled next visit  2. Group B Streptococcus carrier, +RV culture, currently pregnant Prophylaxis in labor  Term labor symptoms and general obstetric precautions including but not limited to vaginal bleeding, contractions, leaking of fluid and fetal movement were reviewed in detail with the patient. Please refer to After Visit Summary  for other counseling recommendations.   Return in about 1 week (around 06/04/2022) for ROB, Low risk, virtual or in person (patient choice).  Future Appointments  Date Time Provider [place]  06/04/2022  1:30 PM Elvera Maria, CNM [facility] None  06/11/2022  1:30 PM Inez Catalina, MD [facility] None    Mora Bellman, MD

## 2022-05-31 DIAGNOSIS — H5213 Myopia, bilateral: Secondary | ICD-10-CM | POA: Diagnosis not present

## 2022-06-04 ENCOUNTER — Encounter: Payer: Self-pay | Admitting: Advanced Practice Midwife

## 2022-06-04 ENCOUNTER — Ambulatory Visit (INDEPENDENT_AMBULATORY_CARE_PROVIDER_SITE_OTHER): Payer: Medicaid Other | Admitting: Advanced Practice Midwife

## 2022-06-04 VITALS — BP 127/76 | HR 90 | Wt 223.8 lb

## 2022-06-04 DIAGNOSIS — Z3A38 38 weeks gestation of pregnancy: Secondary | ICD-10-CM

## 2022-06-04 DIAGNOSIS — R102 Pelvic and perineal pain: Secondary | ICD-10-CM

## 2022-06-04 DIAGNOSIS — O99820 Streptococcus B carrier state complicating pregnancy: Secondary | ICD-10-CM

## 2022-06-04 DIAGNOSIS — Z3480 Encounter for supervision of other normal pregnancy, unspecified trimester: Secondary | ICD-10-CM

## 2022-06-04 DIAGNOSIS — O26893 Other specified pregnancy related conditions, third trimester: Secondary | ICD-10-CM

## 2022-06-04 NOTE — Progress Notes (Signed)
   PRENATAL VISIT NOTE  Subjective:  Beverly Shah is a 22 y.o. G3P1011 at [redacted]w[redacted]d being seen today for ongoing prenatal care.  She is currently monitored for the following issues for this low-risk pregnancy and has Supervision of other normal pregnancy, antepartum and Group B Streptococcus carrier, +RV culture, currently pregnant on their problem list.  Patient reports occasional contractions.  Contractions: Irritability. Vag. Bleeding: None.  Movement: Present. Denies leaking of fluid.   The following portions of the patient's history were reviewed and updated as appropriate: allergies, current medications, past family history, past medical history, past social history, past surgical history and problem list.   Objective:   Vitals:   06/04/22 1400  BP: 127/76  Pulse: 90  Weight: 223 lb 12.8 oz (101.5 kg)    Fetal Status: Fetal Heart Rate (bpm): 151 Fundal Height: 38 cm Movement: Present  Presentation: Vertex  General:  Alert, oriented and cooperative. Patient is in no acute distress.  Skin: Skin is warm and dry. No rash noted.   Cardiovascular: Normal heart rate noted  Respiratory: Normal respiratory effort, no problems with respiration noted  Abdomen: Soft, gravid, appropriate for gestational age.  Pain/Pressure: Present     Pelvic: Cervical exam performed in the presence of a chaperone Dilation: 1.5 Effacement (%): 50 Station: -3  Extremities: Normal range of motion.  Edema: Trace  Mental Status: Normal mood and affect. Normal behavior. Normal judgment and thought content.   Assessment and Plan:  Pregnancy: G3P1011 at [redacted]w[redacted]d 1. Supervision of other normal pregnancy, antepartum --Anticipatory guidance about next visits/weeks of pregnancy given.  --Exam in office today, labor readiness discussed, including the [employer].   2. Group B Streptococcus carrier, +RV culture, currently pregnant --prophylaxis in labor  3. Pelvic pain affecting pregnancy in third  trimester, antepartum --Rest/ice/heat/warm bath/increase PO fluids/Tylenol/pregnancy support belt   4. [redacted] weeks gestation of pregnancy   Term labor symptoms and general obstetric precautions including but not limited to vaginal bleeding, contractions, leaking of fluid and fetal movement were reviewed in detail with the patient. Please refer to After Visit Summary for other counseling recommendations.   Return in about 1 week (around 06/11/2022) for Any provider, LOB.  Future Appointments  Date Time Provider [place]  06/11/2022  1:30 PM Inez Catalina, MD [place] None    Fatima Blank, CNM

## 2022-06-04 NOTE — Progress Notes (Signed)
Pt presents for ROB visit. Pt reports increased pelvic pain and "popping". Pt also reports intermittent chest pain that feels like a "stabbing feeling" and mild headaches. Pt has questions about elective induction.

## 2022-06-10 ENCOUNTER — Inpatient Hospital Stay (HOSPITAL_COMMUNITY)
Admission: AD | Admit: 2022-06-10 | Discharge: 2022-06-10 | Disposition: A | Discharge status: Home / Self Care | Payer: Medicaid Other | Attending: Obstetrics and Gynecology | Admitting: Obstetrics and Gynecology

## 2022-06-10 ENCOUNTER — Other Ambulatory Visit: Payer: Self-pay

## 2022-06-10 ENCOUNTER — Encounter (HOSPITAL_COMMUNITY): Payer: Self-pay | Admitting: Obstetrics and Gynecology

## 2022-06-10 DIAGNOSIS — Z3A39 39 weeks gestation of pregnancy: Secondary | ICD-10-CM | POA: Diagnosis not present

## 2022-06-10 DIAGNOSIS — O479 False labor, unspecified: Secondary | ICD-10-CM | POA: Diagnosis not present

## 2022-06-10 DIAGNOSIS — O471 False labor at or after 37 completed weeks of gestation: Secondary | ICD-10-CM | POA: Insufficient documentation

## 2022-06-10 NOTE — MAU Note (Signed)
.  Beverly Shah is a 22 y.o. at [redacted]w[redacted]d here in MAU reporting: had spotting of pink/red blood along with cramps/tightening in abdomen. Denies LOF. +FM  Onset of complaint: 0345 Pain score: 2 Vitals:   06/10/22 0452  BP: 128/68  Pulse: 94  Resp: 18  Temp: 98.9 F (37.2 C)  SpO2: 99%     FHT:153

## 2022-06-10 NOTE — Discharge Instructions (Signed)

## 2022-06-10 NOTE — MAU Provider Note (Signed)
S: [name] is a 22 y.o. G3P1011 at [redacted]w[redacted]d  who presents to MAU today complaining vaginal spotting that has now resolved and contractions. She denies vaginal bleeding. She denies LOF. She reports normal fetal movement.    O: BP 122/73   Pulse 76   Temp 98.9 F (37.2 C) (Oral)   Resp 17   Ht 5\' 3"  (1.6 m)   Wt 104.4 kg   LMP 09/06/2021   SpO2 99%   BMI 40.76 kg/m  GENERAL: Well-developed, well-nourished female in no acute distress.  HEAD: Normocephalic, atraumatic.  CHEST: Normal effort of breathing, regular heart rate ABDOMEN: Soft, nontender, gravid  Cervical exam:  Dilation: 1.5 Effacement (%): 60 Cervical Position: Posterior Station: -3 Presentation: Undeterminable Exam by:: Apolinar Junes RN   Fetal Monitoring: Baseline: 150 Variability: moderate Accelerations: 15x15 Decelerations: none Contractions: irritability   A: SIUP at [redacted]w[redacted]d  False labor  P: Precautions given for return. Water birth information given to pt per request. Discharge home, OB appt tomorrow  Shelda Pal, DO 06/10/2022 6:55 AM

## 2022-06-11 ENCOUNTER — Ambulatory Visit (INDEPENDENT_AMBULATORY_CARE_PROVIDER_SITE_OTHER): Payer: Medicaid Other | Admitting: Obstetrics and Gynecology

## 2022-06-11 ENCOUNTER — Encounter: Payer: Self-pay | Admitting: Obstetrics and Gynecology

## 2022-06-11 VITALS — BP 106/73 | HR 91 | Wt 226.0 lb

## 2022-06-11 DIAGNOSIS — O99820 Streptococcus B carrier state complicating pregnancy: Secondary | ICD-10-CM

## 2022-06-11 DIAGNOSIS — Z3480 Encounter for supervision of other normal pregnancy, unspecified trimester: Secondary | ICD-10-CM

## 2022-06-11 DIAGNOSIS — Z3A39 39 weeks gestation of pregnancy: Secondary | ICD-10-CM

## 2022-06-11 DIAGNOSIS — Z8759 Personal history of other complications of pregnancy, childbirth and the puerperium: Secondary | ICD-10-CM

## 2022-06-11 NOTE — Patient Instructions (Signed)
[email or web address]

## 2022-06-11 NOTE — Progress Notes (Signed)
   PRENATAL VISIT NOTE  Subjective:  Beverly Shah is a 22 y.o. G3P1011 at [redacted]w[redacted]d being seen today for ongoing prenatal care.  Beverly Shah is currently monitored for the following issues for this low-risk pregnancy and has Supervision of other normal pregnancy, antepartum; Group B Streptococcus carrier, +RV culture, currently pregnant; and History of vacuum extraction assisted delivery on their problem list.  Patient reports no complaints.  Contractions: Not present. Vag. Bleeding: None.  Movement: Present. Denies leaking of fluid.   The following portions of the patient's history were reviewed and updated as appropriate: allergies, current medications, past family history, past medical history, past social history, past surgical history and problem list.   Objective:   Vitals:   06/11/22 1346  BP: 106/73  Pulse: 91  Weight: 226 lb (102.5 kg)   Fetal Status: Fetal Heart Rate (bpm): 135   Movement: Present     General:  Alert, oriented and cooperative. Patient is in no acute distress.  Skin: Skin is warm and dry. No rash noted.   Cardiovascular: Normal heart rate noted  Respiratory: Normal respiratory effort, no problems with respiration noted  Abdomen: Soft, gravid, appropriate for gestational age.  Pain/Pressure: Present      Assessment and Plan:  Pregnancy: G3P1011 at [redacted]w[redacted]d 1. Supervision of other normal pregnancy, antepartum 2. [redacted] weeks gestation of pregnancy Membranes swept. Advised to anticipate some spotting, cramping. Discussed MAU precautions for bleeding enough to saturate panty liner, LOF, DFM or labor  3. Group B Streptococcus carrier, +RV culture, currently pregnant PCN in labor  4. History of vacuum extraction assisted delivery  Term labor symptoms and general obstetric precautions including but not limited to vaginal bleeding, contractions, leaking of fluid and fetal movement were reviewed in detail with the patient.  Please refer to After Visit Summary for  other counseling recommendations.   Return in about 1 week (around 06/18/2022).  Future Appointments  Date Time Provider [place]  06/18/2022  1:50 PM Shelly Bombard, MD [place] None  06/21/2022  7:15 AM [facility] SCHED ROOM [facility] None   Inez Catalina, MD

## 2022-06-14 ENCOUNTER — Telehealth (HOSPITAL_COMMUNITY): Payer: Self-pay | Admitting: *Deleted

## 2022-06-14 ENCOUNTER — Encounter (HOSPITAL_COMMUNITY): Payer: Self-pay | Admitting: *Deleted

## 2022-06-14 NOTE — Telephone Encounter (Signed)
Preadmission screen  

## 2022-06-18 ENCOUNTER — Ambulatory Visit (INDEPENDENT_AMBULATORY_CARE_PROVIDER_SITE_OTHER): Payer: Medicaid Other | Admitting: Obstetrics

## 2022-06-18 ENCOUNTER — Encounter: Payer: Self-pay | Admitting: Obstetrics

## 2022-06-18 VITALS — BP 120/75 | HR 102 | Wt 227.8 lb

## 2022-06-18 DIAGNOSIS — O480 Post-term pregnancy: Secondary | ICD-10-CM | POA: Diagnosis not present

## 2022-06-18 DIAGNOSIS — Z3A40 40 weeks gestation of pregnancy: Secondary | ICD-10-CM | POA: Diagnosis not present

## 2022-06-18 DIAGNOSIS — Z3480 Encounter for supervision of other normal pregnancy, unspecified trimester: Secondary | ICD-10-CM

## 2022-06-18 DIAGNOSIS — O99820 Streptococcus B carrier state complicating pregnancy: Secondary | ICD-10-CM

## 2022-06-18 DIAGNOSIS — Z8759 Personal history of other complications of pregnancy, childbirth and the puerperium: Secondary | ICD-10-CM

## 2022-06-18 NOTE — Progress Notes (Signed)
Pt presents for ROB visit. Pt reports leaking fluid 5 days ago, no episodes since then. Pt also c/o pain and stiffness in the hands. Pt requesting cervical check and membrane sweep.

## 2022-06-18 NOTE — Progress Notes (Signed)
Subjective:  Beverly Shah is a 22 y.o. G3P1011 at [redacted]w[redacted]d being seen today for ongoing prenatal care.  She is currently monitored for the following issues for this high-risk pregnancy and has Supervision of other normal pregnancy, antepartum; Group B Streptococcus carrier, +RV culture, currently pregnant; and History of vacuum extraction assisted delivery on their problem list.  Patient reports no complaints.  Contractions: Not present. Vag. Bleeding: None.  Movement: Present. Denies leaking of fluid.   The following portions of the patient's history were reviewed and updated as appropriate: allergies, current medications, past family history, past medical history, past social history, past surgical history and problem list. Problem list updated.  Objective:   Vitals:   06/18/22 1354  BP: 120/75  Pulse: (!) 102  Weight: 227 lb 12.8 oz (103.3 kg)    Fetal Status:     Movement: Present     General:  Alert, oriented and cooperative. Patient is in no acute distress.  Skin: Skin is warm and dry. No rash noted.   Cardiovascular: Normal heart rate noted  Respiratory: Normal respiratory effort, no problems with respiration noted  Abdomen: Soft, gravid, appropriate for gestational age. Pain/Pressure: Present     Pelvic:  Cervical exam performed      2 cm / 50% / -3 / Vtx  Extremities: Normal range of motion.  Edema: Mild pitting, slight indentation  Mental Status: Normal mood and affect. Normal behavior. Normal judgment and thought content.   Urinalysis:      Assessment and Plan:  Pregnancy: G3P1011 at [redacted]w[redacted]d  1. Supervision of other normal pregnancy, antepartum Rx: - Fetal nonstress test:  Reactive.  FHR= 140 bpm.  Good variability.  15x15 accels.  No decels  2. Group B Streptococcus carrier, +RV culture, currently pregnant - treat in labor  3. History of vacuum extraction assisted delivery   Term labor symptoms and general obstetric precautions including but not limited to  vaginal bleeding, contractions, leaking of fluid and fetal movement were reviewed in detail with the patient. Please refer to After Visit Summary for other counseling recommendations.   Return for postpartum visit.   Shelly Bombard, MD 06/18/22

## 2022-06-20 ENCOUNTER — Other Ambulatory Visit: Payer: Self-pay | Admitting: Advanced Practice Midwife

## 2022-06-21 ENCOUNTER — Inpatient Hospital Stay (HOSPITAL_COMMUNITY): Payer: Medicaid Other

## 2022-06-21 ENCOUNTER — Inpatient Hospital Stay (HOSPITAL_COMMUNITY)
Admission: RE | Admit: 2022-06-21 | Discharge: 2022-06-24 | DRG: 807 | Disposition: A | Discharge status: Home / Self Care | Payer: Medicaid Other | Attending: Family Medicine | Admitting: Family Medicine

## 2022-06-21 ENCOUNTER — Encounter (HOSPITAL_COMMUNITY): Payer: Self-pay | Admitting: Obstetrics and Gynecology

## 2022-06-21 DIAGNOSIS — O99820 Streptococcus B carrier state complicating pregnancy: Secondary | ICD-10-CM | POA: Diagnosis not present

## 2022-06-21 DIAGNOSIS — Z3490 Encounter for supervision of normal pregnancy, unspecified, unspecified trimester: Principal | ICD-10-CM | POA: Diagnosis present

## 2022-06-21 DIAGNOSIS — Z3480 Encounter for supervision of other normal pregnancy, unspecified trimester: Secondary | ICD-10-CM

## 2022-06-21 DIAGNOSIS — Z3A41 41 weeks gestation of pregnancy: Secondary | ICD-10-CM | POA: Diagnosis not present

## 2022-06-21 DIAGNOSIS — O480 Post-term pregnancy: Secondary | ICD-10-CM | POA: Diagnosis not present

## 2022-06-21 DIAGNOSIS — O99824 Streptococcus B carrier state complicating childbirth: Secondary | ICD-10-CM | POA: Diagnosis not present

## 2022-06-21 LAB — CBC
HCT: 37.4 % (ref 36.0–46.0)
Hemoglobin: 12.2 g/dL (ref 12.0–15.0)
MCH: 29.5 pg (ref 26.0–34.0)
MCHC: 32.6 g/dL (ref 30.0–36.0)
MCV: 90.3 fL (ref 80.0–100.0)
Platelets: 215 10*3/uL (ref 150–400)
RBC: 4.14 MIL/uL (ref 3.87–5.11)
RDW: 14.6 % (ref 11.5–15.5)
WBC: 9.1 10*3/uL (ref 4.0–10.5)
nRBC: 0 % (ref 0.0–0.2)

## 2022-06-21 LAB — TYPE AND SCREEN
ABO/RH(D): O POS
Antibody Screen: NEGATIVE

## 2022-06-21 MED ORDER — TERBUTALINE SULFATE 1 MG/ML IJ SOLN: 0.2500 mg | Freq: Once | INTRAMUSCULAR | Status: DC | PRN

## 2022-06-21 MED ORDER — LACTATED RINGERS IV SOLN
500.0000 mL | INTRAVENOUS | Status: DC | PRN
  Administered 2022-06-21: 1000 mL via INTRAVENOUS

## 2022-06-21 MED ORDER — OXYTOCIN-SODIUM CHLORIDE 30-0.9 UT/500ML-% IV SOLN
2.5000 [IU]/h | INTRAVENOUS | Status: DC
  Administered 2022-06-22: 2.5 [IU]/h via INTRAVENOUS
  Filled 2022-06-21: qty 500

## 2022-06-21 MED ORDER — LIDOCAINE HCL (PF) 1 % IJ SOLN: 30.0000 mL | INTRAMUSCULAR | Status: DC | PRN

## 2022-06-21 MED ORDER — MISOPROSTOL 50MCG HALF TABLET
50.0000 ug | ORAL_TABLET | Freq: Once | ORAL | Status: AC
  Administered 2022-06-21: 50 ug via ORAL
  Filled 2022-06-21: qty 1

## 2022-06-21 MED ORDER — OXYTOCIN BOLUS FROM INFUSION
333.0000 mL | Freq: Once | INTRAVENOUS | Status: AC
  Administered 2022-06-22: 333 mL via INTRAVENOUS

## 2022-06-21 MED ORDER — SODIUM CHLORIDE 0.9 % IV SOLN
5.0000 10*6.[IU] | Freq: Once | INTRAVENOUS | Status: AC
  Administered 2022-06-21: 5 10*6.[IU] via INTRAVENOUS
  Filled 2022-06-21: qty 5

## 2022-06-21 MED ORDER — PENICILLIN G POT IN DEXTROSE 60000 UNIT/ML IV SOLN
3.0000 10*6.[IU] | INTRAVENOUS | Status: DC
  Administered 2022-06-21 – 2022-06-22 (×2): 3 10*6.[IU] via INTRAVENOUS
  Filled 2022-06-21 (×5): qty 50

## 2022-06-21 MED ORDER — MISOPROSTOL 25 MCG QUARTER TABLET
25.0000 ug | ORAL_TABLET | Freq: Once | ORAL | Status: AC
  Administered 2022-06-21: 25 ug via VAGINAL
  Filled 2022-06-21: qty 1

## 2022-06-21 MED ORDER — SOD CITRATE-CITRIC ACID 500-334 MG/5ML PO SOLN: 30.0000 mL | ORAL | Status: DC | PRN

## 2022-06-21 MED ORDER — FENTANYL CITRATE (PF) 100 MCG/2ML IJ SOLN
50.0000 ug | INTRAMUSCULAR | Status: DC | PRN
  Administered 2022-06-22: 100 ug via INTRAVENOUS
  Filled 2022-06-21: qty 2

## 2022-06-21 MED ORDER — ONDANSETRON HCL 4 MG/2ML IJ SOLN: 4.0000 mg | Freq: Four times a day (QID) | INTRAMUSCULAR | Status: DC | PRN

## 2022-06-21 MED ORDER — ACETAMINOPHEN 325 MG PO TABS: 650.0000 mg | ORAL_TABLET | ORAL | Status: DC | PRN

## 2022-06-21 MED ORDER — OXYTOCIN-SODIUM CHLORIDE 30-0.9 UT/500ML-% IV SOLN
1.0000 m[IU]/min | INTRAVENOUS | Status: DC
  Administered 2022-06-21: 2 m[IU]/min via INTRAVENOUS

## 2022-06-21 NOTE — H&P (Signed)
OBSTETRIC ADMISSION HISTORY AND PHYSICAL  Beverly Shah is a 22 y.o. female G63P1011 with IUP at [redacted]w[redacted]d by LMP presenting for IOL for post-dates. She reports +FMs, No LOF, no VB, no blurry vision, headaches or peripheral edema, and RUQ pain.  She plans on breast feeding. She requests declines for birth control. She received her prenatal care at [facility], [place]  Dating: By LMP --->  Estimated Date of Delivery: 06/13/22  Sono:    @[redacted]w[redacted]d , CWD, normal anatomy, cephalic presentation, [id number], 26% EFW   Prenatal History/Complications:  - GBS + - History of vacuum extraction assisted delivery       Nursing Staff Provider  Office Location  [facility] Dating  LMP c/w [place]  [facility] Model Valu.Nieves ] Traditional [ ]  Centering [ ]  Mom-Baby Dyad      Language  English Anatomy US  normal  Flu Vaccine   Declined 05/21/22 Genetic/Carrier Screen  NIPS:  Low risks  AFP:    Horizon:  TDaP Vaccine   05/21/2022 Hgb A1C or  GTT Early  Third trimester   COVID Vaccine Not vaccinated   LAB RESULTS   Rhogam    Blood Type O/Positive/-- (09/25 1527)   Baby Feeding Plan Breast Antibody Negative (09/25 1527)  Contraception Declined Rubella 1.11 (09/25 1527)  Circumcision Declined RPR Non Reactive (01/08 0950)   Pediatrician  [facility] HBsAg Negative (09/25 1527)   Support Person Boyfriend and mom HCVAb negative  Prenatal Classes   HIV Non Reactive (01/08 0950)     BTL Consent   GBS  POSITIVE   VBAC Consent   Pap Negative 11/2021           DME Rx Valu.Nieves ] BP cuff Valu.Nieves ] Weight Scale Waterbirth  [ ]  Class [ ]  Consent [ ]  CNM visit  PHQ9 & GAD7 [ X ] new OB [  ] 28 weeks  Valu.Nieves ] 36 weeks Induction  [ ]  Orders Entered [ ] Foley Y/N    Past Medical History: Past Medical History:  Diagnosis Date   Vacuum-assisted vaginal delivery 09/15/2018    Past Surgical History: Past Surgical History:  Procedure Laterality Date   NO PAST SURGERIES      Obstetrical History: OB History     Gravida  3   Para  1    Term  1   Preterm      AB  1   Living  1      SAB  1   IAB      Ectopic      Multiple  0   Live Births  1           Social History Social History   Socioeconomic History   Marital status: Single    Spouse name: Not on file   Number of children: Not on file   Years of education: Not on file   Highest education level: Not on file  Occupational History   Not on file  Tobacco Use   Smoking status: Never   Smokeless tobacco: Never  Vaping Use   Vaping Use: Never used  Substance and Sexual Activity   Alcohol use: Not Currently   Drug use: Not Currently    Types: Marijuana    Comment: not since confirmed pregnancy   Sexual activity: Yes    Partners: Male    Birth control/protection: None  Other Topics Concern   Not on file  Social History Narrative   Not on file   Social  Determinants of Health   Financial Resource Strain: Not on file  Food Insecurity: Not on file  Transportation Needs: Not on file  Physical Activity: Not on file  Stress: Not on file  Social Connections: Not on file    Family History: History reviewed. No pertinent family history.  Allergies: No Known Allergies  Medications Prior to Admission  Medication Sig Dispense Refill Last Dose   Blood Pressure Monitoring (BLOOD PRESSURE KIT) DEVI 1 kit by Does not apply route once a week. 1 each 0    Misc. Devices (GOJJI WEIGHT SCALE) MISC 1 Device by Does not apply route every 30 (thirty) days. 1 each 0    pantoprazole (PROTONIX) 40 MG tablet Take 1 tablet (40 mg total) by mouth daily. (Patient not taking: Reported on 06/10/2022) 30 tablet 1    Prenat-Fe Poly-Methfol-FA-DHA (VITAFOL ULTRA) 29-0.6-0.4-200 MG CAPS Take 1 tablet by mouth daily. 30 capsule 12      Review of Systems   All systems reviewed and negative except as stated in HPI  Blood pressure 119/69, pulse 86, temperature 98.4 F (36.9 C), temperature source Oral, resp. rate 16, height 5\' 3"  (1.6 m), weight 105.6 kg, last  menstrual period 09/06/2021, SpO2 98 %, unknown if currently breastfeeding. General appearance: alert, cooperative, and mild distress and breathing through contractions. Lungs: clear to auscultation bilaterally Heart: regular rate and rhythm Abdomen: soft, gravid Extremities: trace edema Presentation: cephalic Fetal monitoringBaseline: 135 bpm, Variability: Good {> 6 bpm), and Accelerations: Reactive Uterine activity: occasional CTX Dilation: 2 Effacement (%): 50 Station: Ballotable Exam by:: Beverly Shah RNC   Prenatal labs: ABO, Rh: O/Positive/-- (09/25 1527) Antibody: Negative (09/25 1527) Rubella: 1.11 (09/25 1527) RPR: Non Reactive (01/08 0950)  HBsAg: Negative (09/25 1527)  HIV: Non Reactive (01/08 0950)  GBS: Positive/-- (03/04 1615)  Fasting glucose 85 Genetic screening: Low risk Anatomy US: normal  Prenatal Transfer Tool  Maternal Diabetes: No Genetic Screening: Normal Maternal Ultrasounds/Referrals: Normal Fetal Ultrasounds or other Referrals:  None Maternal Substance Abuse:  No Significant Maternal Medications:  Meds include: Protonix Significant Maternal Lab Results:  Group B Strep positive Number of Prenatal Visits:greater than 3 verified prenatal visits Other Comments:  None  No results found for this or any previous visit (from the past 24 hour(s)).  Patient Active Problem List   Diagnosis Date Noted   Encounter for induction of labor 06/21/2022   History of vacuum extraction assisted delivery 06/11/2022   Group B Streptococcus carrier, +RV culture, currently pregnant 05/25/2022   Supervision of other normal pregnancy, antepartum 11/13/2021    Assessment/Plan:  Beverly Shah is a 22 y.o. G3P1011 at [redacted]w[redacted]d here for IOL.  #Labor:Cytotec 50 oral, 25 vaginal, encouraged ambulation. #Pain: Per patient preference. Wants to try NO, maybe fentanyl and epidural. #FWB: Cat 1 #ID:  GBS+, PCN #MOF: breast #MOC: considering depo #Circ:   declined  Beverly Baas, MD  06/21/2022, 2:41 PM

## 2022-06-21 NOTE — Plan of Care (Signed)

## 2022-06-21 NOTE — Progress Notes (Signed)
Patient Vitals for the past 4 hrs:  BP Temp Temp src Pulse Resp  06/21/22 1905 125/61 98 F (36.7 C) Oral 63 17  06/21/22 1600 120/60 -- -- 79 15   Feels a little crampy.  FHR Cat 1.  UI on EFM.  Cx 2-3/80/-2  Pt declined AROM at this time, wants a balloon and pitocin.  Foley inserted by Dr. Larene Beach, inflated w/60cc H20.  Will start pitocin, plan AROM after balloon falls out.

## 2022-06-22 ENCOUNTER — Encounter (HOSPITAL_COMMUNITY): Payer: Self-pay | Admitting: Obstetrics and Gynecology

## 2022-06-22 DIAGNOSIS — Z3A41 41 weeks gestation of pregnancy: Secondary | ICD-10-CM | POA: Diagnosis not present

## 2022-06-22 DIAGNOSIS — O480 Post-term pregnancy: Secondary | ICD-10-CM | POA: Diagnosis not present

## 2022-06-22 DIAGNOSIS — O99820 Streptococcus B carrier state complicating pregnancy: Secondary | ICD-10-CM | POA: Diagnosis not present

## 2022-06-22 LAB — RPR: RPR Ser Ql: NONREACTIVE

## 2022-06-22 MED ORDER — METHYLERGONOVINE MALEATE 0.2 MG/ML IJ SOLN: 0.2000 mg | INTRAMUSCULAR | Status: DC | PRN

## 2022-06-22 MED ORDER — SENNOSIDES-DOCUSATE SODIUM 8.6-50 MG PO TABS
2.0000 | ORAL_TABLET | ORAL | Status: DC
  Administered 2022-06-22 – 2022-06-24 (×2): 2 via ORAL
  Filled 2022-06-22 (×2): qty 2

## 2022-06-22 MED ORDER — MEASLES, MUMPS & RUBELLA VAC IJ SOLR: 0.5000 mL | Freq: Once | INTRAMUSCULAR | Status: DC

## 2022-06-22 MED ORDER — DIBUCAINE (PERIANAL) 1 % EX OINT: 1.0000 | TOPICAL_OINTMENT | CUTANEOUS | Status: DC | PRN

## 2022-06-22 MED ORDER — DIPHENHYDRAMINE HCL 25 MG PO CAPS: 25.0000 mg | ORAL_CAPSULE | Freq: Four times a day (QID) | ORAL | Status: DC | PRN

## 2022-06-22 MED ORDER — ONDANSETRON HCL 4 MG PO TABS: 4.0000 mg | ORAL_TABLET | ORAL | Status: DC | PRN

## 2022-06-22 MED ORDER — BENZOCAINE-MENTHOL 20-0.5 % EX AERO
1.0000 | INHALATION_SPRAY | CUTANEOUS | Status: DC | PRN
  Administered 2022-06-23: 1 via TOPICAL
  Filled 2022-06-22: qty 56

## 2022-06-22 MED ORDER — METHYLERGONOVINE MALEATE 0.2 MG PO TABS: 0.2000 mg | ORAL_TABLET | ORAL | Status: DC | PRN

## 2022-06-22 MED ORDER — IBUPROFEN 600 MG PO TABS
600.0000 mg | ORAL_TABLET | Freq: Four times a day (QID) | ORAL | Status: DC
  Administered 2022-06-22 – 2022-06-23 (×5): 600 mg via ORAL
  Filled 2022-06-22 (×7): qty 1

## 2022-06-22 MED ORDER — COCONUT OIL OIL
1.0000 | TOPICAL_OIL | Status: DC | PRN
  Administered 2022-06-23: 1 via TOPICAL

## 2022-06-22 MED ORDER — BISACODYL 10 MG RE SUPP: 10.0000 mg | Freq: Every day | RECTAL | Status: DC | PRN

## 2022-06-22 MED ORDER — FERROUS SULFATE 325 (65 FE) MG PO TABS
325.0000 mg | ORAL_TABLET | ORAL | Status: DC
  Administered 2022-06-22 – 2022-06-24 (×2): 325 mg via ORAL
  Filled 2022-06-22 (×2): qty 1

## 2022-06-22 MED ORDER — FLEET ENEMA 7-19 GM/118ML RE ENEM: 1.0000 | ENEMA | Freq: Every day | RECTAL | Status: DC | PRN

## 2022-06-22 MED ORDER — SIMETHICONE 80 MG PO CHEW: 80.0000 mg | CHEWABLE_TABLET | ORAL | Status: DC | PRN

## 2022-06-22 MED ORDER — ACETAMINOPHEN 325 MG PO TABS: 650.0000 mg | ORAL_TABLET | ORAL | Status: DC | PRN

## 2022-06-22 MED ORDER — ONDANSETRON HCL 4 MG/2ML IJ SOLN: 4.0000 mg | INTRAMUSCULAR | Status: DC | PRN

## 2022-06-22 MED ORDER — WITCH HAZEL-GLYCERIN EX PADS: 1.0000 | MEDICATED_PAD | CUTANEOUS | Status: DC | PRN

## 2022-06-22 MED ORDER — PRENATAL MULTIVITAMIN CH
1.0000 | ORAL_TABLET | Freq: Every day | ORAL | Status: DC
  Administered 2022-06-22 – 2022-06-24 (×3): 1 via ORAL
  Filled 2022-06-22 (×3): qty 1

## 2022-06-22 MED ORDER — MEDROXYPROGESTERONE ACETATE 150 MG/ML IM SUSP: 150.0000 mg | INTRAMUSCULAR | Status: DC | PRN

## 2022-06-22 MED ORDER — TETANUS-DIPHTH-ACELL PERTUSSIS 5-2.5-18.5 LF-MCG/0.5 IM SUSY: 0.5000 mL | PREFILLED_SYRINGE | Freq: Once | INTRAMUSCULAR | Status: DC

## 2022-06-22 NOTE — Progress Notes (Signed)
Patient Vitals for the past 4 hrs:  BP Temp Temp src Pulse Resp  06/21/22 2347 131/81 98.4 F (36.9 C) Oral 77 18   Foley came out around 2145.  Wanted to wait a while to see if SROM.  Didn't happen, so AROM w/clear fluid around 2355. FHR Cat 1.  Irregular ctx, getting stronger. Pitocin at 12 mu/min Cx 4-590/-2

## 2022-06-22 NOTE — Plan of Care (Signed)
Problem: Education: Goal: Knowledge of General Education information will improve Description: Including pain rating scale, medication(s)/side effects and non-pharmacologic comfort measures 06/22/2022 0354 by Dalia Heading, RN Outcome: Completed/Met 06/21/2022 2355 by Dalia Heading, RN Outcome: Progressing   Problem: Health Behavior/Discharge Planning: Goal: Ability to manage health-related needs will improve 06/22/2022 0354 by Dalia Heading, RN Outcome: Completed/Met 06/21/2022 2355 by Dalia Heading, RN Outcome: Progressing   Problem: Clinical Measurements: Goal: Ability to maintain clinical measurements within normal limits will improve 06/22/2022 0354 by Dalia Heading, RN Outcome: Completed/Met 06/21/2022 2355 by Dalia Heading, RN Outcome: Progressing Goal: Will remain free from infection 06/22/2022 0354 by Dalia Heading, RN Outcome: Completed/Met 06/21/2022 2355 by Dalia Heading, RN Outcome: Progressing Goal: Diagnostic test results will improve 06/22/2022 0354 by Dalia Heading, RN Outcome: Completed/Met 06/21/2022 2355 by Dalia Heading, RN Outcome: Progressing Goal: Respiratory complications will improve 06/22/2022 0354 by Dalia Heading, RN Outcome: Completed/Met 06/21/2022 2355 by Dalia Heading, RN Outcome: Progressing Goal: Cardiovascular complication will be avoided 06/22/2022 0354 by Dalia Heading, RN Outcome: Completed/Met 06/21/2022 2355 by Dalia Heading, RN Outcome: Progressing   Problem: Activity: Goal: Risk for activity intolerance will decrease 06/22/2022 0354 by Dalia Heading, RN Outcome: Completed/Met 06/21/2022 2355 by Dalia Heading, RN Outcome: Progressing   Problem: Nutrition: Goal: Adequate nutrition will be maintained 06/22/2022 0354 by Dalia Heading, RN Outcome: Completed/Met 06/21/2022 2355 by Dalia Heading, RN Outcome: Progressing   Problem: Coping: Goal: Level  of anxiety will decrease 06/22/2022 0354 by Dalia Heading, RN Outcome: Completed/Met 06/21/2022 2355 by Dalia Heading, RN Outcome: Progressing   Problem: Elimination: Goal: Will not experience complications related to bowel motility 06/22/2022 0354 by Dalia Heading, RN Outcome: Completed/Met 06/21/2022 2355 by Dalia Heading, RN Outcome: Progressing Goal: Will not experience complications related to urinary retention 06/22/2022 0354 by Dalia Heading, RN Outcome: Completed/Met 06/21/2022 2355 by Dalia Heading, RN Outcome: Progressing   Problem: Pain Managment: Goal: General experience of comfort will improve 06/22/2022 0354 by Dalia Heading, RN Outcome: Completed/Met 06/21/2022 2355 by Dalia Heading, RN Outcome: Progressing   Problem: Safety: Goal: Ability to remain free from injury will improve 06/22/2022 0354 by Dalia Heading, RN Outcome: Completed/Met 06/21/2022 2355 by Dalia Heading, RN Outcome: Progressing   Problem: Skin Integrity: Goal: Risk for impaired skin integrity will decrease 06/22/2022 0354 by Dalia Heading, RN Outcome: Completed/Met 06/21/2022 2355 by Dalia Heading, RN Outcome: Progressing   Problem: Education: Goal: Knowledge of Childbirth will improve 06/22/2022 0354 by Dalia Heading, RN Outcome: Completed/Met 06/21/2022 2355 by Dalia Heading, RN Outcome: Progressing Goal: Ability to make informed decisions regarding treatment and plan of care will improve 06/22/2022 0354 by Dalia Heading, RN Outcome: Completed/Met 06/21/2022 2355 by Dalia Heading, RN Outcome: Progressing Goal: Ability to state and carry out methods to decrease the pain will improve 06/22/2022 0354 by Dalia Heading, RN Outcome: Completed/Met 06/21/2022 2355 by Dalia Heading, RN Outcome: Progressing Goal: Individualized Educational Video(s) 06/22/2022 0354 by Dalia Heading, RN Outcome:  Completed/Met 06/21/2022 2355 by Dalia Heading, RN Outcome: Progressing   Problem: Coping: Goal: Ability to verbalize concerns and feelings about labor and delivery will improve 06/22/2022 0354 by Dalia Heading, RN Outcome: Completed/Met 06/21/2022 2355 by Dalia Heading, RN Outcome: Progressing   Problem: Life Cycle: Goal: Ability to make normal progression through stages of  labor will improve 06/22/2022 0354 by Dalia HeadingGladney, Makenize Messman D, RN Outcome: Completed/Met 06/21/2022 2355 by Dalia HeadingGladney, Jozelyn Kuwahara D, RN Outcome: Progressing Goal: Ability to effectively push during vaginal delivery will improve 06/22/2022 0354 by Dalia HeadingGladney, Darrin Apodaca D, RN Outcome: Completed/Met 06/21/2022 2355 by Dalia HeadingGladney, Avory Mimbs D, RN Outcome: Progressing   Problem: Role Relationship: Goal: Will demonstrate positive interactions with the child 06/22/2022 0354 by Dalia HeadingGladney, Chrisotpher Rivero D, RN Outcome: Completed/Met 06/21/2022 2355 by Dalia HeadingGladney, Daleiza Bacchi D, RN Outcome: Progressing   Problem: Safety: Goal: Risk of complications during labor and delivery will decrease 06/22/2022 0354 by Dalia HeadingGladney, Tymeer Vaquera D, RN Outcome: Completed/Met 06/21/2022 2355 by Dalia HeadingGladney, Ettamae Barkett D, RN Outcome: Progressing   Problem: Pain Management: Goal: Relief or control of pain from uterine contractions will improve 06/22/2022 0354 by Dalia HeadingGladney, Osric Klopf D, RN Outcome: Completed/Met 06/21/2022 2355 by Dalia HeadingGladney, Keylie Beavers D, RN Outcome: Progressing   Problem: Education: Goal: Knowledge of condition will improve Outcome: Completed/Met Goal: Individualized Educational Video(s) Outcome: Completed/Met Goal: Individualized Newborn Educational Video(s) Outcome: Completed/Met   Problem: Activity: Goal: Will verbalize the importance of balancing activity with adequate rest periods Outcome: Completed/Met Goal: Ability to tolerate increased activity will improve Outcome: Completed/Met   Problem: Coping: Goal: Ability to identify and utilize available  resources and services will improve Outcome: Completed/Met   Problem: Life Cycle: Goal: Chance of risk for complications during the postpartum period will decrease Outcome: Completed/Met   Problem: Role Relationship: Goal: Ability to demonstrate positive interaction with newborn will improve Outcome: Completed/Met   Problem: Skin Integrity: Goal: Demonstration of wound healing without infection will improve Outcome: Completed/Met

## 2022-06-22 NOTE — Discharge Summary (Signed)
Postpartum Discharge Summary  Date of Service update ***     Patient Name: Beverly Shah DOB: 01-May-2000 MRN: [id number]  Date of admission: 06/21/2022 Delivery date:06/22/2022  Delivering provider: Jacklyn ShellRESENZO-DISHMON, FRANCES  Date of discharge: 06/22/2022  Admitting diagnosis: Encounter for induction of labor [Z34.90] Intrauterine pregnancy: [redacted]w[redacted]d     Secondary diagnosis:  Principal Problem:   Encounter for induction of labor Active Problems:   Vaginal delivery  Additional problems: postdates pregnancy    Discharge diagnosis: Term Pregnancy Delivered                                              Post partum procedures:{Postpartum procedures:23558} Augmentation: AROM, Pitocin, Cytotec, and IP Foley Complications: None  Hospital course: Induction of Labor With Vaginal Delivery   22 y.o. yo G3P1011 at 255w2d was admitted to the hospital 06/21/2022 for induction of labor.  Indication for induction: Postdates.  Patient had an labor course complicated by nothing Membrane Rupture Time/Date: 12:02 AM ,06/22/2022   Delivery Method:Vaginal, Spontaneous  Episiotomy: None  Lacerations:  None  Details of delivery can be found in separate delivery note.  Patient had a postpartum course complicated by***. Patient is discharged home 06/22/22.  Newborn Data: Birth date:06/22/2022  Birth time:1:20 AM  Gender:Female  Living status:  Apgars: ,  Weight:   Magnesium Sulfate received: No BMZ received: No Rhophylac:N/A MMR:N/A T-DaP:Given prenatally Flu: No Transfusion:{Transfusion received:30440034}  Physical exam  Vitals:   06/21/22 1359 06/21/22 1600 06/21/22 1905 06/21/22 2347  BP: 119/69 120/60 125/61 131/81  Pulse: 86 79 63 77  Resp: 16 15 17 18   Temp: 98.4 F (36.9 C)  98 F (36.7 C) 98.4 F (36.9 C)  TempSrc: Oral  Oral Oral  SpO2: 98%     Weight: 105.6 kg     Height: 5\' 3"  (1.6 m)      General: {Exam; general:21111117} Lochia: {Desc;  appropriate/inappropriate:30686::"appropriate"} Uterine Fundus: {Desc; firm/soft:30687} Incision: {Exam; incision:21111123} DVT Evaluation: {Exam; dvt:2111122} Labs: Lab Results  Component Value Date   WBC 9.1 06/21/2022   HGB 12.2 06/21/2022   HCT 37.4 06/21/2022   MCV 90.3 06/21/2022   PLT 215 06/21/2022      Latest Ref Rng & Units 01/16/2022   11:03 AM  CMP  Glucose 70 - 99 mg/dL 82   BUN 6 - 20 mg/dL 7   Creatinine 5.620.57 - 1.301.00 mg/dL 8.650.56   Sodium 784134 - 696144 mmol/L 136   Potassium 3.5 - 5.2 mmol/L 4.1   Chloride 96 - 106 mmol/L 103   CO2 20 - 29 mmol/L 20   Calcium 8.7 - 10.2 mg/dL 8.7   Total Protein 6.0 - 8.5 g/dL 6.4   Total Bilirubin 0.0 - 1.2 mg/dL 0.3   Alkaline Phos 44 - 121 IU/L 63   AST 0 - 40 IU/L 15   ALT 0 - 32 IU/L 14    Edinburgh Score:    10/13/2018    1:17 PM  Edinburgh Postnatal Depression Scale Screening Tool  I have been able to laugh and see the funny side of things. 0  I have looked forward with enjoyment to things. 0  I have blamed myself unnecessarily when things went wrong. 0  I have been anxious or worried for no good reason. 0  I have felt scared or panicky for no good reason. 0  Things  have been getting on top of me. 0  I have been so unhappy that I have had difficulty sleeping. 0  I have felt sad or miserable. 0  I have been so unhappy that I have been crying. 0  The thought of harming myself has occurred to me. 0  Edinburgh Postnatal Depression Scale Total 0     After visit meds:  Allergies as of 06/22/2022   No Known Allergies   Med Rec must be completed prior to using this [facility]***        Discharge home in stable condition Infant Feeding: {Baby feeding:23562} Infant Disposition:{CHL IP OB HOME WITH TMBPJP:21624} Discharge instruction: per After Visit Summary and Postpartum booklet. Activity: Advance as tolerated. Pelvic rest for 6 weeks.  Diet: {OB ECXF:07225750} Future Appointments:No future appointments. Follow  up Visit:   Please schedule this patient for a Virtual postpartum visit in 4 weeks with the following provider: Any provider. Additional Postpartum F/U:  Low risk pregnancy complicated by:  Delivery mode:  Vaginal, Spontaneous  Anticipated Birth Control:   declines   06/22/2022 Jacklyn Shell, CNM

## 2022-06-22 NOTE — Lactation Note (Signed)
This note was copied from a baby's chart. Lactation Consultation Note  Patient Name: Boy Kyrsti Chesson [id number] Date: 06/22/2022 Age:23 hours Reason for consult: Initial assessment;Primapara;1st time breastfeeding;Breastfeeding assistance;Term  LC entered the room and the infant was laying next to the birth parent.  The birth parent stated that things were going well with breastfeeding.  She had been using the side lying position.  LC asked if she would like to try the football position.  The birth parent agreed.  LC assisted the parent with putting the infant to the left breast in the football position, but the infant was not interested in feeding.  Per the birth parent the infant had recently fed.  LC spoke with the birth parent about feeding cues, cluster feeding, infant stomach size, and supply and demand.  The birth parent does not have a pump at home.  LC sent the stork pump form to see if she qualifies for a pump.  All questions were answered.   Infant Feeding Plan:  Breastfeed 8+ times in 24 hours according to feeding cues.  Hand express and feed the infant expressed milk via a spoon.  Call RN/LC for assistance with breastfeeding.    Maternal Data Has patient been taught Hand Expression?: Yes Does the patient have breastfeeding experience prior to this delivery?: Yes How long did the patient breastfeed?: She attempted with her first  Feeding Mother's Current Feeding Choice: Breast Milk  LATCH Score                    Lactation Tools Discussed/Used    Interventions Interventions: Breast feeding basics reviewed;Adjust position;Support pillows;Assisted with Armed forces technical officer  Discharge    Consult Status Consult Status: Follow-up    Delene Loll 06/22/2022, 11:43 AM

## 2022-06-23 NOTE — Lactation Note (Signed)
This note was copied from a baby's chart. Lactation Consultation Note  Patient Name: Beverly Shah [id number] Date: 06/23/2022 Age:23 hours Reason for consult: Follow-up assessment;Term  LC in to room for follow up. Lactating parent reports discomfort when using DEBP and manual pump. Reviewed flanges and fitted 21-mm. Collected ~55mL of expressed breast milk with fitting. Reinforced frequency/use/care and milk storage. LC encouraged pacing when bottlefeeding, frequent burping and upright position as well as following appropriate volume per age. Provided "bottlefeeding your baby" sheet.  Assisted with latch, football position demonstrating how to use pillows, nose to nipple, proper alignment and neck/back support. Observed deep latch with suckling for ~5 minutes, then infant fell asleep. Spoonfed expressed colostrum. Parent feels more comfortable to latch later.  Reviewed normal newborn behavior after 24h, expected output, tummy size and feeding frequency.  Plan:   1-Feeding on demand.  2-Hand pump use or when formula feeding, volume according to age, pace bottled feeding, frequent burping and upright position. 4-Encouraged birthing parent hydration, nutrition and rest.  Contact LC as needed for feeds/support/concerns/questions. All questions answered at this time. LC shared local resources available upon discharge.    Maternal Data Has patient been taught Hand Expression?: Yes Does the patient have breastfeeding experience prior to this delivery?: Yes How long did the patient breastfeed?: <1 month  Feeding Mother's Current Feeding Choice: Breast Milk and Formula Nipple Type: Slow - flow  LATCH Score Latch: Grasps breast easily, tongue down, lips flanged, rhythmical sucking.  Audible Swallowing: A few with stimulation  Type of Nipple: Everted at rest and after stimulation (short shafted)  Comfort (Breast/Nipple): Soft / non-tender  Hold (Positioning): Assistance needed  to correctly position infant at breast and maintain latch.  LATCH Score: 8   Lactation Tools Discussed/Used Tools: Pump;Flanges Flange Size: 21 Breast pump type: Manual;Double-Electric Breast Pump Pump Education: Milk Storage;Setup, frequency, and cleaning Reason for Pumping: stimulation and supplementation Pumping frequency: at least 8x in 24h Pumped volume: 3 mL  Interventions Interventions: Breast feeding basics reviewed;Assisted with latch;Skin to skin;Breast massage;Hand express;Breast compression;Hand pump;Expressed milk;DEBP;Support pillows;Adjust position;Position options;Education;Pace feeding  Discharge Discharge Education: Engorgement and breast care Pump: DEBP;Manual;Personal WIC Program: No  Consult Status Consult Status: Follow-up Date: 06/24/22 Follow-up type: In-patient    Amaiah Cristiano A Higuera Ancidey 06/23/2022, 9:41 AM

## 2022-06-23 NOTE — Progress Notes (Signed)
Post Partum Day 1 s/p VD at [redacted]w[redacted]d after IOL for postdates  Subjective: No complaints, up ad lib, voiding, tolerating PO, and + flatus. Baby is stable at bedside. Breast and bottlefeeding.  Moderate lochia reported, no concerns.  Objective: Blood pressure 112/62, pulse 73, temperature 98.5 F (36.9 C), temperature source Oral, resp. rate 16, height 5\' 3"  (1.6 m), weight 105.6 kg, last menstrual period 09/06/2021, SpO2 99 %, unknown if currently breastfeeding.  Physical Exam:  General: alert and no distress Lochia: appropriate Uterine Fundus: firm, nontender DVT Evaluation: No evidence of DVT seen on physical exam. Negative Homan's sign. No cords or calf tenderness. No significant calf/ankle edema.  Recent Labs    06/21/22 1427  HGB 12.2  HCT 37.4    Assessment/Plan: Breastfeeding, and Lactation consult Declined circumcision and desires Depo Provera for contraception. Routine postpartum care. Plan for discharge later today or tomorrow.    LOS: 2 days   Jaynie Collins, MD 06/23/2022, 7:49 AM

## 2022-06-24 MED ORDER — IBUPROFEN 600 MG PO TABS: 600.0000 mg | ORAL_TABLET | Freq: Four times a day (QID) | ORAL | 0 refills | Status: DC

## 2022-06-24 NOTE — Progress Notes (Signed)
Pt discharged after discharge instructions given. All questions answered. Pt discharged in stable condition with all belongings.  

## 2022-06-28 ENCOUNTER — Telehealth (HOSPITAL_COMMUNITY): Payer: Self-pay

## 2022-06-28 NOTE — Telephone Encounter (Signed)
Chart review.

## 2022-07-02 ENCOUNTER — Telehealth (HOSPITAL_COMMUNITY): Payer: Self-pay | Admitting: *Deleted

## 2022-07-02 NOTE — Telephone Encounter (Signed)
Patient voiced no questions or concerns regarding her health at this time. EPDS=0. Patient voiced no questions or concerns regarding infant at this time. RN reviewed ABCs of safe sleep. Patient verbalized understanding. Patient requested RN email information on hospital's virtual postpartum classes and support groups. Patient also requested contact information for medical records. Email sent. Deforest Hoyles, RN, 07/02/22, [phone]

## 2022-07-03 DIAGNOSIS — H52223 Regular astigmatism, bilateral: Secondary | ICD-10-CM | POA: Diagnosis not present

## 2022-07-03 DIAGNOSIS — H5211 Myopia, right eye: Secondary | ICD-10-CM | POA: Diagnosis not present

## 2022-07-20 ENCOUNTER — Telehealth (INDEPENDENT_AMBULATORY_CARE_PROVIDER_SITE_OTHER): Payer: Medicaid Other | Admitting: Obstetrics and Gynecology

## 2022-07-20 NOTE — Progress Notes (Signed)
Provider location: Center for [facility] [place]   Patient location: Home  I connected with Beverly Shah on 07/20/22 at 10:55 AM EDT by Mychart Video Encounter and verified that I am speaking with the correct person using two identifiers.       I discussed the limitations, risks, security and privacy concerns of performing an evaluation and management service virtually and the availability of in person appointments. I also discussed with the patient that there may be a patient responsible charge related to this service. The patient expressed understanding and agreed to proceed.  Post Partum Visit Note Subjective:   Beverly Shah is a 22 y.o. [id number] female who presents for a postpartum visit. She is 4 weeks postpartum following a normal spontaneous vaginal delivery.  I have fully reviewed the prenatal and intrapartum course. The delivery was at 41 gestational weeks.  Anesthesia: none. Postpartum course has been uncomplicated. Baby is doing well. Baby is feeding by breast. Bleeding staining only. Bowel function is normal. Bladder function is normal. Patient is not sexually active. Contraception method is none. Postpartum depression screening: negative, score 0.   The pregnancy intention screening data noted above was reviewed. Potential methods of contraception were discussed. The patient elected to proceed with condoms for now.   Edinburgh Postnatal Depression Scale - 07/20/22 1142       Edinburgh Postnatal Depression Scale:  In the Past 7 Days   I have been able to laugh and see the funny side of things. 0    I have looked forward with enjoyment to things. 0    I have blamed myself unnecessarily when things went wrong. 0    I have been anxious or worried for no good reason. 0    I have felt scared or panicky for no good reason. 0    Things have been getting on top of me. 0    I have been so unhappy that I have had difficulty sleeping. 0    I have felt sad or  miserable. 0    I have been so unhappy that I have been crying. 0    The thought of harming myself has occurred to me. 0    Edinburgh Postnatal Depression Scale Total 0             The following portions of the patient's history were reviewed and updated as appropriate: allergies, current medications, past family history, past medical history, past social history, past surgical history, and problem list.  Review of Systems Pertinent items are noted in HPI.  Objective:  LMP 09/06/2021     General:  Alert, oriented and cooperative. Patient is in no acute distress.  Respiratory: Normal respiratory effort, no problems with respiration noted  Mental Status: Normal mood and affect. Normal behavior. Normal judgment and thought content.  Rest of physical exam deferred due to type of encounter   Assessment:    normal postpartum exam.  Plan:  Essential components of care per ACOG recommendations:  1.  Mood and well being: Patient with negative depression screening today. Reviewed local resources for support.  - Patient does not use tobacco.  - hx of drug use? No   2. Infant care and feeding:  -Patient currently breastmilk feeding? Yes , If breastmilk feeding discussed return to work and pumping. If needed, patient was provided letter for work to allow for every 2-3 hr pumping breaks, and to be granted a private location to express breastmilk and refrigerated area to  store breastmilk. Reviewed importance of draining breast regularly to support lactation. -Social determinants of health (SDOH) reviewed in EPIC. No concerns.  3. Sexuality, contraception and birth spacing - Patient does not want a pregnancy in the next year.  Desired family size is 2 children.  - Reviewed forms of contraception in tiered fashion. Patient desired abstinence today.   - Discussed birth spacing of 18 months  4. Sleep and fatigue -Encouraged family/partner/community support of 4 hrs of uninterrupted sleep to  help with mood and fatigue  5. Physical Recovery  - Discussed patients delivery and complications - Patient had no lacerations, perineal healing reviewed. Patient expressed understanding - Patient has urinary incontinence?  - Patient is safe to resume physical and sexual activity  6.  Health Maintenance - Last pap smear done 9/23 and was normal with negative HPV.   7. Chronic Disease - PCP follow up  I provided 10 minutes of face-to-face time during this encounter.  Information given regarding paragard Iud, pt is interested in nonhormonal intervention  Return in about 1 year (around 07/20/2023) for Annual.  Warden Fillers, MD Center for [facility]

## 2022-07-20 NOTE — Patient Instructions (Signed)
[email or web address]

## 2022-07-26 ENCOUNTER — Encounter: Payer: Self-pay | Admitting: Obstetrics and Gynecology

## 2022-07-26 ENCOUNTER — Inpatient Hospital Stay (HOSPITAL_COMMUNITY)
Admission: AD | Admit: 2022-07-26 | Discharge: 2022-07-26 | Disposition: A | Discharge status: Home / Self Care | Payer: Medicaid Other | Attending: Obstetrics & Gynecology | Admitting: Obstetrics & Gynecology

## 2022-07-26 ENCOUNTER — Ambulatory Visit: Payer: Medicaid Other | Admitting: Obstetrics

## 2022-07-26 DIAGNOSIS — R42 Dizziness and giddiness: Secondary | ICD-10-CM | POA: Diagnosis not present

## 2022-07-26 DIAGNOSIS — O9089 Other complications of the puerperium, not elsewhere classified: Secondary | ICD-10-CM | POA: Diagnosis not present

## 2022-07-26 LAB — URINALYSIS, ROUTINE W REFLEX MICROSCOPIC
Bilirubin Urine: NEGATIVE
Glucose, UA: NEGATIVE mg/dL
Hgb urine dipstick: NEGATIVE
Ketones, ur: NEGATIVE mg/dL
Leukocytes,Ua: NEGATIVE
Nitrite: NEGATIVE
Protein, ur: NEGATIVE mg/dL
Specific Gravity, Urine: 1.015 (ref 1.005–1.030)
pH: 6 (ref 5.0–8.0)

## 2022-07-26 MED ORDER — PREPLUS 27-1 MG PO TABS: 1.0000 | ORAL_TABLET | Freq: Every day | ORAL | 13 refills | Status: AC

## 2022-07-26 NOTE — MAU Note (Signed)
.  Beverly Shah is a 22 y.o. postpartum. She had svd 4//5/24 and everything is fine from delivery. Since Tuesday night she has felt off balance and dizziness which is worse with movement. She had this for one day while pregnant but none since. Yesterday she could not hear out of left ear and had ear pain but that subsided. Had h/a Tuesday before dizziness started but that subsided. Does have a mild h/a now with pain of 1  Onset of complaint: Tuesday night Pain score: 1 Vitals:   07/26/22 2208 07/26/22 2213  BP:  121/72  Pulse: 81   Resp: 17   Temp: 98.5 F (36.9 C)   SpO2: 98%      FHT:n/a Lab orders placed from triage:  u/a

## 2022-07-26 NOTE — MAU Note (Signed)
Dorathy Daft CNM in Triage to see pt and discuss plan of care

## 2022-07-26 NOTE — MAU Provider Note (Signed)
Event Date/Time   First Provider Initiated Contact with Patient 07/26/22 2311      S Ms. Clodagh Aiello Beverly Shah is a 22 y.o. [id number] 4 weeks PP patient who presents to MAU today with complaint of feeling lightheaded and "off balance" since yesterday. She states she feels lightheaded when she stands too quickly or bends over too fast. She states she had a ear infection a few days again on the left ear. She endorses resting while baby rest and increasing water intake.   O BP 124/65 (BP Location: Right Arm)   Pulse 77   Temp 98.5 F (36.9 C)   Resp 17   Ht 5' 3.5" (1.613 m)   Wt 97.1 kg   SpO2 98%   Breastfeeding Yes   BMI 37.31 kg/m  Physical Exam Vitals and nursing note reviewed.  Constitutional:      General: She is not in acute distress.    Appearance: Normal appearance.  HENT:     Head: Normocephalic.  Pulmonary:     Effort: Pulmonary effort is normal.  Musculoskeletal:     Cervical back: Normal range of motion.  Skin:    General: Skin is warm and dry.  Neurological:     Mental Status: She is alert and oriented to person, place, and time.  Psychiatric:        Mood and Affect: Mood normal.     A Medical screening exam started - Vertigo like symptoms.  - pal for discharge.   P Discharge from MAU in stable condition  List of options for follow-up given  Warning signs for worsening condition that would warrant emergency follow-up discussed Patient may return to MAU as needed   [name], [place] 07/26/2022 11:39 PM

## 2022-07-26 NOTE — Progress Notes (Signed)
Written and verbal d/c instructions given and understanding voiced. 

## 2023-04-26 ENCOUNTER — Telehealth: Payer: Medicaid Other | Admitting: Nurse Practitioner

## 2023-04-26 DIAGNOSIS — R42 Dizziness and giddiness: Secondary | ICD-10-CM

## 2023-04-26 MED ORDER — MECLIZINE HCL 12.5 MG PO TABS
12.5000 mg | ORAL_TABLET | Freq: Two times a day (BID) | ORAL | 0 refills | Status: AC | PRN
Start: 2023-04-26 — End: 2023-04-29

## 2023-04-26 NOTE — Progress Notes (Signed)
 Virtual Visit Consent   Beverly Shah, you are scheduled for a virtual visit with a [facility] provider today. Just as with appointments in the office, your consent must be obtained to participate. Your consent will be active for this visit and any virtual visit you may have with one of our providers in the next 365 days. If you have a MyChart account, a copy of this consent can be sent to you electronically.  As this is a virtual visit, video technology does not allow for your provider to perform a traditional examination. This may limit your provider's ability to fully assess your condition. If your provider identifies any concerns that need to be evaluated in person or the need to arrange testing (such as labs, EKG, etc.), we will make arrangements to do so. Although advances in technology are sophisticated, we cannot ensure that it will always work on either your end or our end. If the connection with a video visit is poor, the visit may have to be switched to a telephone visit. With either a video or telephone visit, we are not always able to ensure that we have a secure connection.  By engaging in this virtual visit, you consent to the provision of healthcare and authorize for your insurance to be billed (if applicable) for the services provided during this visit. Depending on your insurance coverage, you may receive a charge related to this service.  I need to obtain your verbal consent now. Are you willing to proceed with your visit today? Beverly Shah has provided verbal consent on 04/26/2023 for a virtual visit (video or telephone). Beverly Kitty, FNP  Date: 04/26/2023 6:09 PM  Virtual Visit via Video Note   I, Beverly Shah, connected with  Beverly Shah  ([id number], [birth year]) on 04/26/23 at  6:15 PM EST by a video-enabled telemedicine application and verified that I am speaking with the correct person using two identifiers.  Location: Patient: Virtual  Visit Location Patient: Home Provider: Virtual Visit Location Provider: Home Office   I discussed the limitations of evaluation and management by telemedicine and the availability of in person appointments. The patient expressed understanding and agreed to proceed.    History of Present Illness: Beverly Shah is a 23 y.o. who identifies as a female who was assigned female at birth, and is being seen today for headache and ears feeling full   Symptom onset was yesterday   Initially felt ear was clogged (left) no pain  That resolved and then she woke up at 5am today with dizziness  She went back to sleep  When she got up around 9am when she sat up she felt dizzy again  She then developed nausea  She has vomited a few times today  Also have one episode of diarrhea this morning   She has had vertigo in the past  She went to the ED in the past for her symptoms 07/26/22 Symptoms resolved on their own without medical intervention within one day at that time   She was able to eat dinner  Denies history of HTN  Denies any recent runny nose or nasal congestion   She has had an intermittent headache  Not localized to any specific area- possibly top front   She denies any trauma or head injury recently   She is breastfeeding- non exclusive baby is 10months old  Eating solids   Problems:  Patient Active Problem List   Diagnosis Date Noted   Encounter for  induction of labor 06/21/2022   History of vacuum extraction assisted delivery 06/11/2022   Group B Streptococcus carrier, +RV culture, currently pregnant 05/25/2022   Supervision of other normal pregnancy, antepartum 11/13/2021   Vaginal delivery 09/15/2018    Allergies: No Known Allergies Medications:  Current Outpatient Medications:    Blood Pressure Monitoring (BLOOD PRESSURE KIT) DEVI, 1 kit by Does not apply route once a week., Disp: 1 each, Rfl: 0   ibuprofen  (ADVIL ) 600 MG tablet, Take 1 tablet (600 mg total) by  mouth every 6 (six) hours. (Patient not taking: Reported on 07/26/2022), Disp: 30 tablet, Rfl: 0   Misc. Devices (GOJJI WEIGHT SCALE) MISC, 1 Device by Does not apply route every 30 (thirty) days., Disp: 1 each, Rfl: 0   Prenat-Fe Poly-Methfol-FA-DHA (VITAFOL  ULTRA) 29-0.6-0.4-200 MG CAPS, Take 1 tablet by mouth daily. (Patient not taking: Reported on 07/26/2022), Disp: 30 capsule, Rfl: 12   Prenatal Vit-Fe Fumarate-FA (PREPLUS) 27-1 MG TABS, Take 1 tablet by mouth daily., Disp: 30 tablet, Rfl: 13  Observations/Objective: Patient is well-developed, well-nourished in no acute distress.  Resting comfortably  at home.  Head is normocephalic, atraumatic.  No labored breathing.  Speech is clear and coherent with logical content.  Patient is alert and oriented at baseline.    Assessment and Plan:   1. Vertigo (Primary)  Meds ordered this encounter  Medications   meclizine  (ANTIVERT ) 12.5 MG tablet    Sig: Take 1 tablet (12.5 mg total) by mouth 2 (two) times daily as needed for up to 3 days for dizziness or nausea.    Dispense:  6 tablet    Refill:  0    Discussed intermittent use while breastfeeding  She will try a dosage tonight - if no improvement she will seek evaluation in UC/ED as discussed   Increase fluids  Change positions slowly - education on vertigo sent        Follow Up Instructions: I discussed the assessment and treatment plan with the patient. The patient was provided an opportunity to ask questions and all were answered. The patient agreed with the plan and demonstrated an understanding of the instructions.  A copy of instructions were sent to the patient via MyChart unless otherwise noted below.    The patient was advised to call back or seek an in-person evaluation if the symptoms worsen or if the condition fails to improve as anticipated.    Beverly Kitty, FNP

## 2023-10-25 ENCOUNTER — Telehealth

## 2023-10-25 ENCOUNTER — Other Ambulatory Visit: Payer: Self-pay

## 2023-10-25 ENCOUNTER — Emergency Department (HOSPITAL_COMMUNITY)
Admission: EM | Admit: 2023-10-25 | Discharge: 2023-10-26 | Disposition: A | Discharge status: Home / Self Care | Attending: Emergency Medicine | Admitting: Emergency Medicine

## 2023-10-25 ENCOUNTER — Encounter (HOSPITAL_COMMUNITY): Payer: Self-pay | Admitting: *Deleted

## 2023-10-25 DIAGNOSIS — N61 Mastitis without abscess: Secondary | ICD-10-CM | POA: Insufficient documentation

## 2023-10-25 DIAGNOSIS — N644 Mastodynia: Secondary | ICD-10-CM

## 2023-10-25 LAB — HCG, SERUM, QUALITATIVE: Preg, Serum: NEGATIVE

## 2023-10-25 LAB — LIPASE, BLOOD: Lipase: 35 U/L (ref 11–51)

## 2023-10-25 LAB — CBC
HCT: 39 % (ref 36.0–46.0)
Hemoglobin: 12.8 g/dL (ref 12.0–15.0)
MCH: 29.8 pg (ref 26.0–34.0)
MCHC: 32.8 g/dL (ref 30.0–36.0)
MCV: 90.9 fL (ref 80.0–100.0)
Platelets: 205 K/uL (ref 150–400)
RBC: 4.29 MIL/uL (ref 3.87–5.11)
RDW: 12.6 % (ref 11.5–15.5)
WBC: 15.3 K/uL — ABNORMAL HIGH (ref 4.0–10.5)
nRBC: 0 % (ref 0.0–0.2)

## 2023-10-25 LAB — COMPREHENSIVE METABOLIC PANEL WITH GFR
ALT: 16 U/L (ref 0–44)
AST: 18 U/L (ref 15–41)
Albumin: 3.7 g/dL (ref 3.5–5.0)
Alkaline Phosphatase: 73 U/L (ref 38–126)
Anion gap: 12 (ref 5–15)
BUN: <5 mg/dL — ABNORMAL LOW (ref 6–20)
CO2: 22 mmol/L (ref 22–32)
Calcium: 8.6 mg/dL — ABNORMAL LOW (ref 8.9–10.3)
Chloride: 100 mmol/L (ref 98–111)
Creatinine, Ser: 0.76 mg/dL (ref 0.44–1.00)
GFR, Estimated: >60 mL/min (ref >60)
Glucose, Bld: 133 mg/dL — ABNORMAL HIGH (ref 70–99)
Potassium: 3.3 mmol/L — ABNORMAL LOW (ref 3.5–5.1)
Sodium: 134 mmol/L — ABNORMAL LOW (ref 135–145)
Total Bilirubin: 0.6 mg/dL (ref 0.0–1.2)
Total Protein: 7.1 g/dL (ref 6.5–8.1)

## 2023-10-25 LAB — URINALYSIS, ROUTINE W REFLEX MICROSCOPIC
Bilirubin Urine: NEGATIVE
Glucose, UA: NEGATIVE mg/dL
Ketones, ur: 5 mg/dL — AB
Leukocytes,Ua: NEGATIVE
Nitrite: NEGATIVE
Protein, ur: NEGATIVE mg/dL
Specific Gravity, Urine: 1.019 (ref 1.005–1.030)
pH: 6 (ref 5.0–8.0)

## 2023-10-25 MED ORDER — CEPHALEXIN 500 MG PO CAPS
500.0000 mg | ORAL_CAPSULE | Freq: Four times a day (QID) | ORAL | 0 refills | Status: AC
Start: 2023-10-25 — End: 2023-11-01

## 2023-10-25 NOTE — ED Triage Notes (Signed)
 The pt is c/o swelling lt breast and a fever with redness since yesterday  she has been breast feeding for over a year  today on a video call  they called in keflex  she has taken x 2 po  last tylenol  was  1800

## 2023-10-25 NOTE — Progress Notes (Signed)
 Virtual Visit Consent   [name], you are scheduled for a virtual visit with a [facility] provider today. Just as with appointments in the office, your consent must be obtained to participate. Your consent will be active for this visit and any virtual visit you may have with one of our providers in the next 365 days. If you have a MyChart account, a copy of this consent can be sent to you electronically.  As this is a virtual visit, video technology does not allow for your provider to perform a traditional examination. This may limit your provider's ability to fully assess your condition. If your provider identifies any concerns that need to be evaluated in person or the need to arrange testing (such as labs, EKG, etc.), we will make arrangements to do so. Although advances in technology are sophisticated, we cannot ensure that it will always work on either your end or our end. If the connection with a video visit is poor, the visit may have to be switched to a telephone visit. With either a video or telephone visit, we are not always able to ensure that we have a secure connection.  By engaging in this virtual visit, you consent to the provision of healthcare and authorize for your insurance to be billed (if applicable) for the services provided during this visit. Depending on your insurance coverage, you may receive a charge related to this service.  I need to obtain your verbal consent now. Are you willing to proceed with your visit today? Beverly Shah has provided verbal consent on 10/25/2023 for a virtual visit (video or telephone). Lauraine Kitty, FNP  Date: 10/25/2023 7:57 AM   Virtual Visit via Video Note   I, Lauraine Kitty, connected with  Beverly Shah  ([id number], 11-26-2000) on 10/25/23 at  8:00 AM EDT by a video-enabled telemedicine application and verified that I am speaking with the correct person using two identifiers.  Location: Patient: Virtual  Visit Location Patient: Home Provider: Virtual Visit Location Provider: Home Office   I discussed the limitations of evaluation and management by telemedicine and the availability of in person appointments. The patient expressed understanding and agreed to proceed.    History of Present Illness: Beverly Shah is a 23 y.o. who identifies as a female who was assigned female at birth, and is being seen today for fever body aches and chills.   Her symptoms started yesterday in her left breast that was followed by body aches, chills and headache. She felt warm but did not take her temperature. She did feel lump that she was able to feel that was warm and hard, that improved somewhat after breastfeeding her son last night. The lump size decreased and she has some relief.  She is currently breastfeeding, her baby is 75 months old.   She has been taking tylenol  for pain relief and that did help.    Problems:  Patient Active Problem List   Diagnosis Date Noted   Encounter for induction of labor 06/21/2022   History of vacuum extraction assisted delivery 06/11/2022   Group B Streptococcus carrier, +RV culture, currently pregnant 05/25/2022   Supervision of other normal pregnancy, antepartum 11/13/2021   Vaginal delivery 09/15/2018    Allergies: No Known Allergies Medications:  Current Outpatient Medications:    Blood Pressure Monitoring (BLOOD PRESSURE KIT) DEVI, 1 kit by Does not apply route once a week., Disp: 1 each, Rfl: 0   ibuprofen  (ADVIL ) 600 MG tablet, Take 1  tablet (600 mg total) by mouth every 6 (six) hours. (Patient not taking: Reported on 07/26/2022), Disp: 30 tablet, Rfl: 0   Misc. Devices (GOJJI WEIGHT SCALE) MISC, 1 Device by Does not apply route every 30 (thirty) days., Disp: 1 each, Rfl: 0   Prenat-Fe Poly-Methfol-FA-DHA (VITAFOL  ULTRA) 29-0.6-0.4-200 MG CAPS, Take 1 tablet by mouth daily. (Patient not taking: Reported on 07/26/2022), Disp: 30 capsule, Rfl: 12   Prenatal  Vit-Fe Fumarate-FA (PREPLUS) 27-1 MG TABS, Take 1 tablet by mouth daily., Disp: 30 tablet, Rfl: 13  Observations/Objective:   Patient is well-developed, well-nourished in no acute distress.  Resting comfortably  at home.  Head is normocephalic, atraumatic.  No labored breathing.  Speech is clear and coherent with logical content.  Patient is alert and oriented at baseline.    Assessment and Plan:   1. Mastitis (Primary) Education sent in AVS   - cephALEXin  (KEFLEX ) 500 MG capsule; Take 1 capsule (500 mg total) by mouth 4 (four) times daily for 7 days.  Dispense: 28 capsule; Refill: 0    Follow Up Instructions: I discussed the assessment and treatment plan with the patient. The patient was provided an opportunity to ask questions and all were answered. The patient agreed with the plan and demonstrated an understanding of the instructions.  A copy of instructions were sent to the patient via MyChart unless otherwise noted below.    The patient was advised to call back or seek an in-person evaluation if the symptoms worsen or if the condition fails to improve as anticipated.    Lauraine Kitty, FNP

## 2023-10-26 MED ORDER — LACTATED RINGERS IV BOLUS
2000.0000 mL | Freq: Once | INTRAVENOUS | Status: AC
  Administered 2023-10-26: 2000 mL via INTRAVENOUS

## 2023-10-26 MED ORDER — IBUPROFEN 400 MG PO TABS
600.0000 mg | ORAL_TABLET | Freq: Once | ORAL | Status: AC
  Administered 2023-10-26: 600 mg via ORAL
  Filled 2023-10-26: qty 1

## 2023-10-26 MED ORDER — IBUPROFEN 600 MG PO TABS: 600.0000 mg | ORAL_TABLET | Freq: Four times a day (QID) | ORAL | 0 refills | Status: AC

## 2023-10-26 NOTE — ED Notes (Signed)
 Initial vitals retake showed patient with slightly low BP and tachycardic, retake showed more normal BP. Patient not ill appearing, stating she had no major symptoms.

## 2023-10-26 NOTE — Discharge Instructions (Addendum)
 You were seen today for left-sided breast pain. You appear to have cellulitis of your left breast. You have been prescribed antibiotics and I recommend you continue taking these.  You may also continue taking Continue taking medications for fevers as we discussed. If you have breakthrough fevers, uncontrolled pain, lightheadedness, syncope, chest pain, shortness of breath or any acute changes in your symptoms, please immediately return to the emergency department.

## 2023-10-26 NOTE — ED Provider Notes (Signed)
 [facility] Provider Note   CSN: [id number] Arrival date & time: 10/25/23  2140     History Chief Complaint  Patient presents with   Breast Pain    HPI Beverly Shah is a 23 y.o. female presenting for chief complaint of ongoing breast pain and redness. States that she has been having redness in the breast yesterday Fevers shortly after Started on ABX today by telehealth Comes in tonight due to addition of malaise and cramps  Patient's recorded medical, surgical, social, medication list and allergies were reviewed in the Snapshot window as part of the initial history.   Review of Systems   Review of Systems  Constitutional:  Positive for fever. Negative for chills.  HENT:  Negative for ear pain and sore throat.   Eyes:  Negative for pain and visual disturbance.  Respiratory:  Negative for cough and shortness of breath.   Cardiovascular:  Negative for chest pain and palpitations.  Gastrointestinal:  Negative for abdominal pain and vomiting.  Genitourinary:  Negative for dysuria and hematuria.  Musculoskeletal:  Negative for arthralgias and back pain.  Skin:  Positive for rash. Negative for color change.  Neurological:  Negative for seizures and syncope.  All other systems reviewed and are negative.   Physical Exam Updated Vital Signs BP (!) 99/56   Pulse (!) 106   Temp 98.7 F (37.1 C) (Oral)   Resp 18   Ht 5' 3 (1.6 m)   Wt 97.1 kg   SpO2 99%   BMI 37.92 kg/m  Physical Exam Vitals and nursing note reviewed. Exam conducted with a chaperone present.  Constitutional:      General: She is not in acute distress.    Appearance: She is well-developed.  HENT:     Head: Normocephalic and atraumatic.  Eyes:     Conjunctiva/sclera: Conjunctivae normal.  Cardiovascular:     Rate and Rhythm: Normal rate and regular rhythm.     Heart sounds: No murmur heard. Pulmonary:     Effort: Pulmonary effort is normal. No  respiratory distress.     Breath sounds: Normal breath sounds.  Abdominal:     General: There is no distension.     Palpations: Abdomen is soft.     Tenderness: There is no abdominal tenderness. There is no right CVA tenderness or left CVA tenderness.  Musculoskeletal:        General: No swelling or tenderness. Normal range of motion.     Cervical back: Neck supple.  Skin:    General: Skin is warm and dry.     Comments: Has erythema and redness around her left breast.  No palpable mass.  Neurological:     General: No focal deficit present.     Mental Status: She is alert and oriented to person, place, and time. Mental status is at baseline.     Cranial Nerves: No cranial nerve deficit.      ED Course/ Medical Decision Making/ A&P    Procedures Procedures   Medications Ordered in ED Medications  lactated ringers  bolus 2,000 mL (0 mLs Intravenous Stopped 10/26/23 0621)  ibuprofen  (ADVIL ) tablet 600 mg (600 mg Oral Given 10/26/23 0537)    Medical Decision Making:   23 year old female presenting with mastitis. Coming in mostly for symptomatic management after being diagnosed earlier in the day.  States she was unsure if she was not to take Advil  for management as she is still breast-feeding her 63-month-old. She is  attempted pumping and warm compresses with only moderate symptomatic improvement. On physical exam, she does not clinically have a clear abscess.  She is still draining milk is appropriate. She is in no acute distress clinically stable.  Treated with IV fluids, antipyretics and grossly symptomatically resolved on serial reassessment. She has only been on Keflex  for 12 hours and is already stating that she is seeing receding of the redness. Additionally, she has only had any cellulitis for 2 days which is unlikely to represent enough time to develop an abscess based on her presentation. Recommend she follow-up with recurrent surgery, primary care provider or return to the  emergency department if not improving however she would like to continue attempting outpatient treatment prior to any more aggressive evaluation as she has improved with therapy here in the ER. Disposition:  I have considered need for hospitalization, however, considering all of the above, I believe this patient is stable for discharge at this time.  Patient/family educated about specific return precautions for given chief complaint and symptoms.  Patient/family educated about follow-up with PCP .     Patient/family expressed understanding of return precautions and need for follow-up. Patient spoken to regarding all imaging and laboratory results and appropriate follow up for these results. All education provided in verbal form with additional information in written form. Time was allowed for answering of patient questions. Patient discharged.    Emergency Department Medication Summary:   Medications  lactated ringers  bolus 2,000 mL (0 mLs Intravenous Stopped 10/26/23 0621)  ibuprofen  (ADVIL ) tablet 600 mg (600 mg Oral Given 10/26/23 0537)        Clinical Impression:  1. Breast pain, left   2. Cellulitis of breast      Discharge   Final Clinical Impression(s) / ED Diagnoses Final diagnoses:  Breast pain, left  Cellulitis of breast    Rx / DC Orders ED Discharge Orders          Ordered    ibuprofen  (ADVIL ) 600 MG tablet  Every 6 hours        10/26/23 0457              Jerral Meth, MD 10/26/23 [phone]
